# Patient Record
Sex: Male | Born: 2000 | Race: White | Hispanic: No | Marital: Single | Smoking: Current every day smoker
Health system: Southern US, Community
[De-identification: ages and names within clinical notes are randomized; demographics above are authoritative.]

## PROBLEM LIST (undated history)

## (undated) DIAGNOSIS — R569 Unspecified convulsions: Secondary | ICD-10-CM

## (undated) DIAGNOSIS — F302 Manic episode, severe with psychotic symptoms: Secondary | ICD-10-CM

## (undated) DIAGNOSIS — F840 Autistic disorder: Secondary | ICD-10-CM

## (undated) DIAGNOSIS — J45909 Unspecified asthma, uncomplicated: Secondary | ICD-10-CM

---

## 2019-07-22 ENCOUNTER — Emergency Department (HOSPITAL_COMMUNITY)
Admission: EM | Admit: 2019-07-22 | Discharge: 2019-07-23 | Disposition: A | Discharge status: Home / Self Care | Payer: Medicaid - Out of State | Attending: Emergency Medicine | Admitting: Emergency Medicine

## 2019-07-22 ENCOUNTER — Other Ambulatory Visit: Payer: Self-pay

## 2019-07-22 ENCOUNTER — Encounter (HOSPITAL_COMMUNITY): Payer: Self-pay

## 2019-07-22 DIAGNOSIS — J45909 Unspecified asthma, uncomplicated: Secondary | ICD-10-CM | POA: Insufficient documentation

## 2019-07-22 DIAGNOSIS — G40909 Epilepsy, unspecified, not intractable, without status epilepticus: Secondary | ICD-10-CM | POA: Diagnosis not present

## 2019-07-22 DIAGNOSIS — F840 Autistic disorder: Secondary | ICD-10-CM | POA: Insufficient documentation

## 2019-07-22 DIAGNOSIS — F17210 Nicotine dependence, cigarettes, uncomplicated: Secondary | ICD-10-CM | POA: Insufficient documentation

## 2019-07-22 DIAGNOSIS — F1210 Cannabis abuse, uncomplicated: Secondary | ICD-10-CM | POA: Insufficient documentation

## 2019-07-22 DIAGNOSIS — R569 Unspecified convulsions: Secondary | ICD-10-CM | POA: Diagnosis present

## 2019-07-22 HISTORY — DX: Unspecified asthma, uncomplicated: J45.909

## 2019-07-22 HISTORY — DX: Unspecified convulsions: R56.9

## 2019-07-22 HISTORY — DX: Manic episode, severe with psychotic symptoms: F30.2

## 2019-07-22 HISTORY — DX: Autistic disorder: F84.0

## 2019-07-22 LAB — CBC WITH DIFFERENTIAL/PLATELET
Abs Immature Granulocytes: 0.01 10*3/uL (ref 0.00–0.07)
Basophils Absolute: 0 10*3/uL (ref 0.0–0.1)
Basophils Relative: 1 %
Eosinophils Absolute: 0.2 10*3/uL (ref 0.0–0.5)
Eosinophils Relative: 4 %
HCT: 43.3 % (ref 39.0–52.0)
Hemoglobin: 14 g/dL (ref 13.0–17.0)
Immature Granulocytes: 0 %
Lymphocytes Relative: 35 %
Lymphs Abs: 2.3 10*3/uL (ref 0.7–4.0)
MCH: 28.5 pg (ref 26.0–34.0)
MCHC: 32.3 g/dL (ref 30.0–36.0)
MCV: 88 fL (ref 80.0–100.0)
Monocytes Absolute: 0.5 10*3/uL (ref 0.1–1.0)
Monocytes Relative: 7 %
Neutro Abs: 3.6 10*3/uL (ref 1.7–7.7)
Neutrophils Relative %: 53 %
Platelets: 206 10*3/uL (ref 150–400)
RBC: 4.92 MIL/uL (ref 4.22–5.81)
RDW: 13.7 % (ref 11.5–15.5)
WBC: 6.7 10*3/uL (ref 4.0–10.5)
nRBC: 0 % (ref 0.0–0.2)

## 2019-07-22 LAB — I-STAT CHEM 8, ED
BUN: 14 mg/dL (ref 6–20)
Calcium, Ion: 1.25 mmol/L (ref 1.15–1.40)
Chloride: 105 mmol/L (ref 98–111)
Creatinine, Ser: 0.9 mg/dL (ref 0.61–1.24)
Glucose, Bld: 93 mg/dL (ref 70–99)
HCT: 43 % (ref 39.0–52.0)
Hemoglobin: 14.6 g/dL (ref 13.0–17.0)
Potassium: 3.5 mmol/L (ref 3.5–5.1)
Sodium: 141 mmol/L (ref 135–145)
TCO2: 23 mmol/L (ref 22–32)

## 2019-07-22 MED ORDER — LEVETIRACETAM IN NACL 1000 MG/100ML IV SOLN
1000.0000 mg | Freq: Once | INTRAVENOUS | Status: AC
  Administered 2019-07-22: 1000 mg via INTRAVENOUS
  Filled 2019-07-22: qty 100

## 2019-07-22 NOTE — ED Notes (Signed)
Patient made aware that urine sample is needed. Urinal at bedside.  

## 2019-07-22 NOTE — ED Triage Notes (Signed)
Male friend called EMS d/t pt having seizure like activity last approx. 5 min. Pt has Hx seizure and take dilantin but has not been taking it as ordered.

## 2019-07-23 ENCOUNTER — Encounter (HOSPITAL_COMMUNITY): Payer: Self-pay | Admitting: Emergency Medicine

## 2019-07-23 ENCOUNTER — Emergency Department (HOSPITAL_COMMUNITY): Payer: Medicaid - Out of State

## 2019-07-23 LAB — RAPID URINE DRUG SCREEN, HOSP PERFORMED
Amphetamines: NOT DETECTED
Barbiturates: NOT DETECTED
Benzodiazepines: POSITIVE — AB
Cocaine: NOT DETECTED
Opiates: NOT DETECTED
Tetrahydrocannabinol: POSITIVE — AB

## 2019-07-23 LAB — PHENYTOIN LEVEL, TOTAL: Phenytoin Lvl: <2.5 ug/mL — ABNORMAL LOW (ref 10.0–20.0)

## 2019-07-23 MED ORDER — PHENYTOIN 50 MG PO CHEW
400.0000 mg | CHEWABLE_TABLET | ORAL | Status: AC
  Administered 2019-07-23: 400 mg via ORAL
  Filled 2019-07-23: qty 8

## 2019-07-23 NOTE — ED Notes (Signed)
Patient transported to CT 

## 2019-07-23 NOTE — ED Provider Notes (Signed)
[facility] DEPT Provider Note   CSN: [id number] Arrival date & time: 07/22/19  2257     History   Chief Complaint Chief Complaint  Patient presents with  . Seizures    HPI Alexander Jackson is a 18 y.o. male.     The history is provided by the patient.  Seizures Seizure activity on arrival: no   Seizure type:  Grand mal Preceding symptoms: no sensation of an aura present   Initial focality:  None Episode characteristics: generalized shaking   Episode characteristics: no tongue biting   Postictal symptoms: somnolence   Postictal symptoms: no confusion   Return to baseline: yes   Severity:  Moderate Timing:  Once Progression:  Resolved Context: medical non-compliance   Context: not alcohol withdrawal   Recent head injury:  No recent head injuries PTA treatment:  Midazolam History of seizures: yes   Severity:  Moderate Current therapy:  Phenytoin Compliance with current therapy:  Poor States he rations his medication as he can't afford it. Is feeling improved at this time.    Past Medical History:  Diagnosis Date  . Asthma   . Autism   . Bipolar I disorder, single manic episode, severe, with psychosis ([place])   . Seizures ([place])     There are no active problems to display for this patient.   History reviewed. No pertinent surgical history.      Home Medications    Prior to Admission medications   Not on File    Family History History reviewed. No pertinent family history.  Social History Social History   Tobacco Use  . Smoking status: Current Every Day Smoker    Packs/day: 1.00    Years: 10.00    Pack years: 10.00    Types: Cigarettes  . Smokeless tobacco: Never Used  Substance Use Topics  . Alcohol use: Yes    Comment: occasionally   . Drug use: Yes    Types: Marijuana     Allergies   Patient has no known allergies.   Review of Systems Review of Systems  Constitutional: Negative for fever.  HENT:  Negative for congestion.   Eyes: Negative for visual disturbance.  Respiratory: Negative for shortness of breath.   Cardiovascular: Negative for chest pain.  Gastrointestinal: Negative for abdominal distention.  Genitourinary: Negative for difficulty urinating.  Musculoskeletal: Positive for arthralgias.  Neurological: Positive for seizures.  Psychiatric/Behavioral: Negative for agitation.  All other systems reviewed and are negative.    Physical Exam Updated Vital Signs BP (!) 116/58 (BP Location: Right Arm)   Pulse (!) 57   Temp 98.8 F (37.1 C) (Oral)   Resp 20   Ht 6' (1.829 m)   Wt 90.7 kg   SpO2 96%   BMI 27.12 kg/m   Physical Exam Vitals signs and nursing note reviewed.  Constitutional:      General: He is not in acute distress.    Appearance: He is normal weight.  HENT:     Head: Normocephalic and atraumatic.     Nose: Nose normal.  Eyes:     Conjunctiva/sclera: Conjunctivae normal.     Pupils: Pupils are equal, round, and reactive to light.  Neck:     Musculoskeletal: Normal range of motion and neck supple.  Cardiovascular:     Rate and Rhythm: Normal rate and regular rhythm.     Pulses: Normal pulses.     Heart sounds: Normal heart sounds.  Pulmonary:     Effort: Pulmonary  effort is normal.     Breath sounds: Normal breath sounds.  Abdominal:     General: Abdomen is flat. Bowel sounds are normal.     Tenderness: There is no abdominal tenderness. There is no rebound.  Musculoskeletal: Normal range of motion.  Skin:    General: Skin is warm and dry.     Capillary Refill: Capillary refill takes less than 2 seconds.  Neurological:     General: No focal deficit present.     Mental Status: He is alert and oriented to person, place, and time.  Psychiatric:        Mood and Affect: Mood normal.        Behavior: Behavior normal.      ED Treatments / Results  Labs (all labs ordered are listed, but only abnormal results are displayed) Results for orders  placed or performed during the hospital encounter of 07/22/19  CBC with Differential/Platelet  Result Value Ref Range   WBC 6.7 4.0 - 10.5 K/uL   RBC 4.92 4.22 - 5.81 MIL/uL   Hemoglobin 14.0 13.0 - 17.0 g/dL   HCT 36.1 44.3 - 15.4 %   MCV 88.0 80.0 - 100.0 fL   MCH 28.5 26.0 - 34.0 pg   MCHC 32.3 30.0 - 36.0 g/dL   RDW 00.8 67.6 - 19.5 %   Platelets 206 150 - 400 K/uL   nRBC 0.0 0.0 - 0.2 %   Neutrophils Relative % 53 %   Neutro Abs 3.6 1.7 - 7.7 K/uL   Lymphocytes Relative 35 %   Lymphs Abs 2.3 0.7 - 4.0 K/uL   Monocytes Relative 7 %   Monocytes Absolute 0.5 0.1 - 1.0 K/uL   Eosinophils Relative 4 %   Eosinophils Absolute 0.2 0.0 - 0.5 K/uL   Basophils Relative 1 %   Basophils Absolute 0.0 0.0 - 0.1 K/uL   Immature Granulocytes 0 %   Abs Immature Granulocytes 0.01 0.00 - 0.07 K/uL  Phenytoin level, total  Result Value Ref Range   Phenytoin Lvl <2.5 (L) 10.0 - 20.0 ug/mL  I-stat chem 8, ED (not at [facility] or [facility])  Result Value Ref Range   Sodium 141 135 - 145 mmol/L   Potassium 3.5 3.5 - 5.1 mmol/L   Chloride 105 98 - 111 mmol/L   BUN 14 6 - 20 mg/dL   Creatinine, Ser 0.93 0.61 - 1.24 mg/dL   Glucose, Bld 93 70 - 99 mg/dL   Calcium, Ion 2.67 1.24 - 1.40 mmol/L   TCO2 23 22 - 32 mmol/L   Hemoglobin 14.6 13.0 - 17.0 g/dL   HCT 58.0 99.8 - 33.8 %   No results found.  EKG None  Radiology No results found.  Procedures Procedures (including critical care time)  Medications Ordered in ED Medications  levETIRAcetam (KEPPRA) IVPB 1000 mg/100 mL premix (0 mg Intravenous Stopped 07/23/19 0011)  phenytoin (DILANTIN) chewable tablet 400 mg (400 mg Oral Given 07/23/19 0116)     PO challenged successfully.  Returned to baseline.  Will refer to outpatient neurology.  Informed he cannot drive a car or operate machinery until cleared by neurology or seizure free for more than 6 months.  Patient verbalizes understanding and agrees to follow up.    Alexander Jackson was evaluated in  Emergency Department on 07/23/2019 for the symptoms described in the history of present illness. He was evaluated in the context of the global COVID-19 pandemic, which necessitated consideration that the patient might be at risk for  infection with the SARS-CoV-2 virus that causes COVID-19. Institutional protocols and algorithms that pertain to the evaluation of patients at risk for COVID-19 are in a state of rapid change based on information released by regulatory bodies including the CDC and federal and state organizations. These policies and algorithms were followed during the patient's care in the ED.   Final Clinical Impressions(s) / ED Diagnoses   Final diagnoses:  Seizure disorder (HCC)     Return for intractable cough, coughing up blood,fevers >100.4 unrelieved by medication, shortness of breath, intractable vomiting, chest pain, shortness of breath, weakness,numbness, changes in speech, facial asymmetry,abdominal pain, passing out,Inability to tolerate liquids or food, cough, altered mental status or any concerns. No signs of systemic illness or infection. The patient is nontoxic-appearing on exam and vital signs are within normal limits.   I have reviewed the triage vital signs and the nursing notes. Pertinent labs &imaging results that were available during my care of the patient were reviewed by me and considered in my medical decision making (see chart for details).After history, exam, and medical workup I feel the patient has beenappropriately medically screened and is safe for discharge home. Pertinent diagnoses were discussed with the patient. Patient was given return precautions.   Alethia Melendrez, MD 07/23/19 [id number]

## 2019-07-23 NOTE — ED Notes (Signed)
Patient able to tolerate oral fluids at this time. Will continue to monitor.

## 2019-08-22 ENCOUNTER — Emergency Department (HOSPITAL_COMMUNITY)
Admission: EM | Admit: 2019-08-22 | Discharge: 2019-08-22 | Disposition: A | Discharge status: Home / Self Care | Payer: Medicaid - Out of State | Attending: Emergency Medicine | Admitting: Emergency Medicine

## 2019-08-22 ENCOUNTER — Emergency Department (HOSPITAL_COMMUNITY): Payer: Medicaid - Out of State

## 2019-08-22 ENCOUNTER — Other Ambulatory Visit: Payer: Self-pay

## 2019-08-22 ENCOUNTER — Encounter (HOSPITAL_COMMUNITY): Payer: Self-pay

## 2019-08-22 DIAGNOSIS — F840 Autistic disorder: Secondary | ICD-10-CM | POA: Insufficient documentation

## 2019-08-22 DIAGNOSIS — Z888 Allergy status to other drugs, medicaments and biological substances status: Secondary | ICD-10-CM | POA: Insufficient documentation

## 2019-08-22 DIAGNOSIS — R569 Unspecified convulsions: Secondary | ICD-10-CM | POA: Diagnosis present

## 2019-08-22 DIAGNOSIS — F17210 Nicotine dependence, cigarettes, uncomplicated: Secondary | ICD-10-CM | POA: Insufficient documentation

## 2019-08-22 DIAGNOSIS — J45909 Unspecified asthma, uncomplicated: Secondary | ICD-10-CM | POA: Insufficient documentation

## 2019-08-22 DIAGNOSIS — Z79899 Other long term (current) drug therapy: Secondary | ICD-10-CM | POA: Diagnosis not present

## 2019-08-22 DIAGNOSIS — G40909 Epilepsy, unspecified, not intractable, without status epilepticus: Secondary | ICD-10-CM | POA: Insufficient documentation

## 2019-08-22 LAB — I-STAT CHEM 8, ED
BUN: 11 mg/dL (ref 6–20)
Calcium, Ion: 1.22 mmol/L (ref 1.15–1.40)
Chloride: 103 mmol/L (ref 98–111)
Creatinine, Ser: 1 mg/dL (ref 0.61–1.24)
Glucose, Bld: 101 mg/dL — ABNORMAL HIGH (ref 70–99)
HCT: 43 % (ref 39.0–52.0)
Hemoglobin: 14.6 g/dL (ref 13.0–17.0)
Potassium: 3.6 mmol/L (ref 3.5–5.1)
Sodium: 142 mmol/L (ref 135–145)
TCO2: 25 mmol/L (ref 22–32)

## 2019-08-22 MED ORDER — SODIUM CHLORIDE 0.9 % IV SOLN
1000.0000 mg | Freq: Once | INTRAVENOUS | Status: AC
  Administered 2019-08-22: 1000 mg via INTRAVENOUS
  Filled 2019-08-22: qty 20
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
## 2019-08-22 NOTE — ED Notes (Signed)
Patient transported to CT 
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
## 2020-02-09 ENCOUNTER — Emergency Department (HOSPITAL_COMMUNITY): Payer: Medicaid - Out of State

## 2020-02-09 ENCOUNTER — Emergency Department (HOSPITAL_COMMUNITY)
Admission: EM | Admit: 2020-02-09 | Discharge: 2020-02-09 | Disposition: A | Discharge status: Home / Self Care | Payer: Medicaid - Out of State | Attending: Emergency Medicine | Admitting: Emergency Medicine

## 2020-02-09 ENCOUNTER — Other Ambulatory Visit: Payer: Self-pay

## 2020-02-09 ENCOUNTER — Encounter (HOSPITAL_COMMUNITY): Payer: Self-pay | Admitting: Emergency Medicine

## 2020-02-09 DIAGNOSIS — R569 Unspecified convulsions: Secondary | ICD-10-CM | POA: Diagnosis present

## 2020-02-09 DIAGNOSIS — Z9114 Patient's other noncompliance with medication regimen: Secondary | ICD-10-CM | POA: Insufficient documentation

## 2020-02-09 DIAGNOSIS — G40919 Epilepsy, unspecified, intractable, without status epilepticus: Secondary | ICD-10-CM

## 2020-02-09 DIAGNOSIS — F17210 Nicotine dependence, cigarettes, uncomplicated: Secondary | ICD-10-CM | POA: Diagnosis not present

## 2020-02-09 LAB — CBC WITH DIFFERENTIAL/PLATELET
Abs Immature Granulocytes: 0.01 10*3/uL (ref 0.00–0.07)
Basophils Absolute: 0.1 10*3/uL (ref 0.0–0.1)
Basophils Relative: 1 %
Eosinophils Absolute: 0.3 10*3/uL (ref 0.0–0.5)
Eosinophils Relative: 4 %
HCT: 45 % (ref 39.0–52.0)
Hemoglobin: 14.4 g/dL (ref 13.0–17.0)
Immature Granulocytes: 0 %
Lymphocytes Relative: 25 %
Lymphs Abs: 1.9 10*3/uL (ref 0.7–4.0)
MCH: 28.4 pg (ref 26.0–34.0)
MCHC: 32 g/dL (ref 30.0–36.0)
MCV: 88.8 fL (ref 80.0–100.0)
Monocytes Absolute: 0.6 10*3/uL (ref 0.1–1.0)
Monocytes Relative: 8 %
Neutro Abs: 4.8 10*3/uL (ref 1.7–7.7)
Neutrophils Relative %: 62 %
Platelets: 243 10*3/uL (ref 150–400)
RBC: 5.07 MIL/uL (ref 4.22–5.81)
RDW: 13.3 % (ref 11.5–15.5)
WBC: 7.7 10*3/uL (ref 4.0–10.5)
nRBC: 0 % (ref 0.0–0.2)

## 2020-02-09 LAB — BASIC METABOLIC PANEL
Anion gap: 8 (ref 5–15)
BUN: 14 mg/dL (ref 6–20)
CO2: 24 mmol/L (ref 22–32)
Calcium: 9.1 mg/dL (ref 8.9–10.3)
Chloride: 108 mmol/L (ref 98–111)
Creatinine, Ser: 0.79 mg/dL (ref 0.61–1.24)
GFR calc Af Amer: >60 mL/min (ref >60)
GFR calc non Af Amer: >60 mL/min (ref >60)
Glucose, Bld: 97 mg/dL (ref 70–99)
Potassium: 3.8 mmol/L (ref 3.5–5.1)
Sodium: 140 mmol/L (ref 135–145)

## 2020-02-09 LAB — CBG MONITORING, ED: Glucose-Capillary: 129 mg/dL — ABNORMAL HIGH (ref 70–99)

## 2020-02-09 LAB — ETHANOL: Alcohol, Ethyl (B): <10 mg/dL (ref <10)

## 2020-02-09 MED ORDER — LEVETIRACETAM IN NACL 1000 MG/100ML IV SOLN
1000.0000 mg | Freq: Once | INTRAVENOUS | Status: AC
  Administered 2020-02-09: 11:00:00 1000 mg via INTRAVENOUS
  Filled 2020-02-09: qty 100

## 2020-02-09 MED ORDER — LEVETIRACETAM 500 MG PO TABS: 500.0000 mg | ORAL_TABLET | Freq: Two times a day (BID) | ORAL | 0 refills | Status: DC

## 2020-02-09 NOTE — ED Provider Notes (Signed)
[name]   CSN: [id number] Arrival date & time: 02/09/20  1032     History Chief Complaint  Patient presents with  . Seizures    Alexander Jackson is a 19 y.o. male with a past medical history of autism, bipolar disorder, seizure disorder noncompliant with Keppra presenting to the ED with a chief complaint of seizure.  States that he was skateboarding just prior to arrival when he felt like he had a seizure.  States that he has been having pain to the right side of his head since then.  Also states that "my legs feel like Jell-O" which is typical after he has a seizure.  He admits that he has not been taking his Keppra but unwilling to tell me how long he has been out of it.  Denies any tongue or lip trauma, loss of bowels or bladder, vision changes, numbness in arms or legs, vomiting, fever.  HPI     Past Medical History:  Diagnosis Date  . Asthma   . Autism   . Bipolar I disorder, single manic episode, severe, with psychosis ([place])   . Seizures ([place])     There are no problems to display for this patient.   History reviewed. No pertinent surgical history.     History reviewed. No pertinent family history.  Social History   Tobacco Use  . Smoking status: Current Every Day Smoker    Packs/day: 1.00    Years: 10.00    Pack years: 10.00    Types: Cigarettes  . Smokeless tobacco: Never Used  Substance Use Topics  . Alcohol use: Yes    Comment: occasionally   . Drug use: Yes    Types: Marijuana    Home Medications Prior to Admission medications   Medication Sig Start Date End Date Taking? Authorizing Provider  levETIRAcetam (KEPPRA) 500 MG tablet Take 1 tablet (500 mg total) by mouth in the morning and at bedtime. 02/09/20   Rhenda Oregon, PA-C    Allergies    Geodon [ziprasidone hcl]  Review of Systems   Review of Systems  Constitutional: Negative for appetite change, chills and fever.  HENT: Negative for ear  pain, rhinorrhea, sneezing and sore throat.   Eyes: Negative for photophobia and visual disturbance.  Respiratory: Negative for cough, chest tightness, shortness of breath and wheezing.   Cardiovascular: Negative for chest pain and palpitations.  Gastrointestinal: Negative for abdominal pain, blood in stool, constipation, diarrhea, nausea and vomiting.  Genitourinary: Negative for dysuria, hematuria and urgency.  Musculoskeletal: Negative for myalgias.  Skin: Negative for rash.  Neurological: Positive for seizures and headaches. Negative for dizziness, weakness and light-headedness.    Physical Exam Updated Vital Signs BP 115/66   Pulse 66   Temp 98 F (36.7 C) (Oral)   Resp 20   Ht 5\' 11"  (1.803 m)   Wt 90.7 kg   SpO2 92%   BMI 27.89 kg/m   Physical Exam Vitals and nursing note reviewed.  Constitutional:      General: He is not in acute distress.    Appearance: He is well-developed.  HENT:     Head: Normocephalic and atraumatic.     Nose: Nose normal.     Mouth/Throat:     Comments: No signs of lip or tongue trauma. Eyes:     General: No scleral icterus.       Right eye: No discharge.        Left eye: No discharge.  Conjunctiva/sclera: Conjunctivae normal.     Pupils: Pupils are equal, round, and reactive to light.  Cardiovascular:     Rate and Rhythm: Normal rate and regular rhythm.     Heart sounds: Normal heart sounds. No murmur. No friction rub. No gallop.   Pulmonary:     Effort: Pulmonary effort is normal. No respiratory distress.     Breath sounds: Normal breath sounds.  Abdominal:     General: Bowel sounds are normal. There is no distension.     Palpations: Abdomen is soft.     Tenderness: There is no abdominal tenderness. There is no guarding.  Musculoskeletal:        General: Normal range of motion.     Cervical back: Normal range of motion and neck supple.     Comments: No midline spinal tenderness present in lumbar, thoracic or cervical spine. No  step-off palpated. No visible bruising, edema or temperature change noted. No objective signs of numbness present. No saddle anesthesia. 2+ DP pulses bilaterally. Sensation intact to light touch. Strength 5/5 in bilateral lower extremities.  Skin:    General: Skin is warm and dry.     Findings: No rash.  Neurological:     General: No focal deficit present.     Mental Status: He is alert and oriented to person, place, and time.     Cranial Nerves: No cranial nerve deficit.     Sensory: No sensory deficit.     Motor: No weakness or abnormal muscle tone.     Coordination: Coordination normal.     ED Results / Procedures / Treatments   Labs (all labs ordered are listed, but only abnormal results are displayed) Labs Reviewed  CBG MONITORING, ED - Abnormal; Notable for the following components:      Result Value   Glucose-Capillary 129 (*)    All other components within normal limits  BASIC METABOLIC PANEL  CBC WITH DIFFERENTIAL/PLATELET  ETHANOL    EKG None  Radiology CT HEAD WO CONTRAST  Result Date: 02/09/2020 CLINICAL DATA:  Seizure with apparent fall.  Headache. EXAM: CT HEAD WITHOUT CONTRAST TECHNIQUE: Contiguous axial images were obtained from the base of the skull through the vertex without intravenous contrast. COMPARISON:  August 22, 2019. FINDINGS: Brain: Ventricles and sulci are normal in size and configuration. There is no intracranial mass, hemorrhage, extra-axial fluid collection, or midline shift. The brain parenchyma appears unremarkable. No demonstrable acute infarct. Vascular: No hyperdense vessel. No vascular calcifications are evident. Skull: The bony calvarium appears intact. Sinuses/Orbits: Visualized paranasal sinuses are clear. Visualized orbits appear symmetric bilaterally. Other: Mastoid air cells are clear. IMPRESSION: Study within normal limits. Electronically Signed   By: Bretta Bang III M.D.   On: 02/09/2020 12:01    Procedures Procedures  (including critical care time)  Medications Ordered in ED Medications  levETIRAcetam (KEPPRA) IVPB 1000 mg/100 mL premix (0 mg Intravenous Stopped 02/09/20 1123)    ED Course  I have reviewed the triage vital signs and the nursing notes.  Pertinent labs & imaging results that were available during my care of the patient were reviewed by me and considered in my medical decision making (see chart for details).    MDM Rules/Calculators/A&P                      19 year old male with past medical history of seizure disorder noncompliant with Keppra presenting to the ED for seizure that occurred prior to arrival.  Patient was  skateboarding outside by himself when he believes he had a seizure.  States that he usually has generalized weakness of bilateral lower extremities after his seizure.  Denies any tongue or lip trauma, loss of bowel or bladder function.  He reports pain to the right side of his head and is concerned that he may have hit his head during seizure.  Patient admits that he is not taking his Keppra but unwilling to tell me how long he has been out.  Chart review shows that patient presented to [facility] ED in the beginning of March for breakthrough seizure.  He was given 30-day supply of Keppra I believe he may have run out about 2 weeks ago.  No neurological deficits or trauma noted on exam.  Will obtain lab work and imaging, give loading dose of Keppra and reassess.  Lab work here is unremarkable including electrolytes, blood count and CBG. Normal ethanol level. CT head without any concerning acute findings. Patient given IV dose of Keppra here. Observed for 3.5 hours without any recurrence of seizures.  Stressed importance of taking his Keppra to prevent breakthrough seizures. Told him he will need to follow up with neurology.  He voices understanding.  We will provide him with follow-up and refill.  All imaging, if done today, including plain films, CT scans, and ultrasounds,  independently reviewed by me, and interpretations confirmed via formal radiology reads.  Patient is hemodynamically stable, in NAD, and able to ambulate in the ED. Evaluation does not show pathology that would require ongoing emergent intervention or inpatient treatment. I explained the diagnosis to the patient. Pain has been managed and has no complaints prior to discharge. Patient is comfortable with above plan and is stable for discharge at this time. All questions were answered prior to disposition. Strict return precautions for returning to the ED were discussed. Encouraged follow up with PCP.   An After Visit Summary was printed and given to the patient.   Portions of this note were generated with Scientist, clinical (histocompatibility and immunogenetics). Dictation errors may occur despite best attempts at proofreading.  Final Clinical Impression(s) / ED Diagnoses Final diagnoses:  Breakthrough seizure [facility])    Rx / DC Orders ED Discharge Orders         Ordered    levETIRAcetam (KEPPRA) 500 MG tablet  2 times daily     02/09/20 1345           Dietrich Pates, PA-C 02/09/20 1346    Long, Arlyss Repress, MD 02/10/20 [phone]

## 2020-02-09 NOTE — ED Triage Notes (Signed)
Patient arrived by EMS from somewhere around town in [place]. Patient was skateboarding when patient had an unwitnessed seizure.   Patient c/o RT sided head pain.   Patient has history of Epilepsy, Bipolar, and Autism.

## 2020-02-09 NOTE — Discharge Instructions (Signed)
It is important for you to take your Keppra as prescribed. If you run out of this medication you will need to ask for a refill as soon as possible prevent breakthrough seizures. Return to the ER if you have another seizure, worsening headache, blurry vision, numbness in arms or legs.

## 2020-02-25 ENCOUNTER — Encounter (HOSPITAL_COMMUNITY): Payer: Self-pay | Admitting: Emergency Medicine

## 2020-02-25 ENCOUNTER — Emergency Department (HOSPITAL_COMMUNITY)
Admission: EM | Admit: 2020-02-25 | Discharge: 2020-02-25 | Disposition: A | Discharge status: Home / Self Care | Payer: Medicaid - Out of State | Attending: Emergency Medicine | Admitting: Emergency Medicine

## 2020-02-25 ENCOUNTER — Other Ambulatory Visit: Payer: Self-pay

## 2020-02-25 DIAGNOSIS — R569 Unspecified convulsions: Secondary | ICD-10-CM | POA: Diagnosis present

## 2020-02-25 DIAGNOSIS — F17210 Nicotine dependence, cigarettes, uncomplicated: Secondary | ICD-10-CM | POA: Insufficient documentation

## 2020-02-25 DIAGNOSIS — J45909 Unspecified asthma, uncomplicated: Secondary | ICD-10-CM | POA: Insufficient documentation

## 2020-02-25 DIAGNOSIS — F840 Autistic disorder: Secondary | ICD-10-CM | POA: Diagnosis not present

## 2020-02-25 LAB — CBG MONITORING, ED: Glucose-Capillary: 80 mg/dL (ref 70–99)

## 2020-02-25 MED ORDER — LEVETIRACETAM 500 MG PO TABS
500.0000 mg | ORAL_TABLET | Freq: Once | ORAL | Status: AC
  Administered 2020-02-25: 500 mg via ORAL
  Filled 2020-02-25: qty 1

## 2020-02-25 MED ORDER — LEVETIRACETAM 500 MG PO TABS: 500.0000 mg | ORAL_TABLET | Freq: Two times a day (BID) | ORAL | 0 refills | Status: AC

## 2020-02-25 NOTE — ED Notes (Signed)
Patient verbalizes understanding of discharge instructions. Opportunity for questioning and answers were provided. Armband removed by staff, pt discharged from ED. Pt is homeless, states he plans to walk to next destination "like I always do"
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
## 2020-02-25 NOTE — ED Provider Notes (Signed)
[facility] EMERGENCY DEPARTMENT Provider Note   CSN: [id number] Arrival date & time: 02/25/20  2104     History Chief Complaint  Patient presents with  . Seizures    Alexander Jackson is a 19 y.o. male.  The history is provided by the patient.  Seizures Seizure activity on arrival: no   Seizure type:  Unable to specify Preceding symptoms: no sensation of an aura present, no dizziness, no euphoria, no headache, no hyperventilation, no nausea, no numbness, no panic and no vision change   Episode characteristics: no abnormal movements, no apnea, no combativeness, no confusion, no disorientation, no eye deviation, no focal shaking, no generalized shaking, no incontinence, no limpness, fully responsive, no stiffening, no tongue biting and responsive   Postictal symptoms: no confusion, no memory loss and no somnolence   Return to baseline: yes   Severity:  Mild Timing:  Once Progression:  Resolved Context: medical non-compliance   PTA treatment:  None History of seizures: yes        Past Medical History:  Diagnosis Date  . Asthma   . Autism   . Bipolar I disorder, single manic episode, severe, with psychosis (HCC)   . Seizures (HCC)     There are no problems to display for this patient.   History reviewed. No pertinent surgical history.     History reviewed. No pertinent family history.  Social History   Tobacco Use  . Smoking status: Current Every Day Smoker    Packs/day: 1.00    Years: 10.00    Pack years: 10.00    Types: Cigarettes  . Smokeless tobacco: Never Used  Substance Use Topics  . Alcohol use: Yes    Comment: occasionally   . Drug use: Yes    Types: Marijuana    Home Medications Prior to Admission medications   Medication Sig Start Date End Date Taking? Authorizing Provider  levETIRAcetam (KEPPRA) 500 MG tablet Take 1 tablet (500 mg total) by mouth 2 (two) times daily. 02/25/20 03/26/20  Virgina Norfolk, DO    Allergies    Geodon  [ziprasidone hcl]  Review of Systems   Review of Systems  Constitutional: Negative for chills and fever.  HENT: Negative for ear pain and sore throat.   Eyes: Negative for pain and visual disturbance.  Respiratory: Negative for cough and shortness of breath.   Cardiovascular: Negative for chest pain and palpitations.  Gastrointestinal: Negative for abdominal pain and vomiting.  Genitourinary: Negative for dysuria and hematuria.  Musculoskeletal: Negative for arthralgias and back pain.  Skin: Negative for color change and rash.  Neurological: Positive for seizures. Negative for dizziness, tremors, syncope, facial asymmetry, speech difficulty, weakness, light-headedness, numbness and headaches.  All other systems reviewed and are negative.   Physical Exam Updated Vital Signs  ED Triage Vitals [02/25/20 2107]  Enc Vitals Group     BP 123/67     Pulse Rate 75     Resp 16     Temp 98 F (36.7 C)     Temp Source Oral     SpO2 96 %     Weight      Height      Head Circumference      Peak Flow      Pain Score 0     Pain Loc      Pain Edu?      Excl. in GC?     Physical Exam Vitals and nursing note reviewed.  Constitutional:  General: He is not in acute distress.    Appearance: He is well-developed. He is not ill-appearing.  HENT:     Head: Normocephalic and atraumatic.     Nose: Nose normal.     Mouth/Throat:     Mouth: Mucous membranes are moist.  Eyes:     Extraocular Movements: Extraocular movements intact.     Conjunctiva/sclera: Conjunctivae normal.     Pupils: Pupils are equal, round, and reactive to light.  Cardiovascular:     Rate and Rhythm: Normal rate and regular rhythm.     Pulses: Normal pulses.     Heart sounds: Normal heart sounds. No murmur.  Pulmonary:     Effort: Pulmonary effort is normal. No respiratory distress.     Breath sounds: Normal breath sounds.  Abdominal:     Palpations: Abdomen is soft.     Tenderness: There is no abdominal  tenderness.  Musculoskeletal:        General: Normal range of motion.     Cervical back: Normal range of motion and neck supple. No tenderness.  Skin:    General: Skin is warm and dry.  Neurological:     General: No focal deficit present.     Mental Status: He is alert and oriented to person, place, and time.     Cranial Nerves: No cranial nerve deficit.     Sensory: No sensory deficit.     Motor: No weakness.     Coordination: Coordination normal.     Gait: Gait normal.     Comments: 5+ out of 5 strength throughout, normal sensation, no drift, normal finger-nose-finger  Psychiatric:        Mood and Affect: Mood normal.     ED Results / Procedures / Treatments   Labs (all labs ordered are listed, but only abnormal results are displayed) Labs Reviewed  CBG MONITORING, ED    EKG None  Radiology No results found.  Procedures Procedures (including critical care time)  Medications Ordered in ED Medications  levETIRAcetam (KEPPRA) tablet 500 mg (500 mg Oral Given 02/25/20 2129)    ED Course  I have reviewed the triage vital signs and the nursing notes.  Pertinent labs & imaging results that were available during my care of the patient were reviewed by me and considered in my medical decision making (see chart for details).    MDM Rules/Calculators/A&P                      Alexander Jackson is a 19 year old male history of seizure, bipolar disorder noncompliant.  Possibly homeless.  Normal vitals.  No fever.  Had breakthrough seizure today.  Has been on Keppra in the past.  Blood sugar normal.  Given Keppra here.  At his baseline.  Neurologically intact.  No trauma.  Social work not currently here at this time.  However I have sent prescription for Keppra to the wellness center.  Patient states that if social work can call his emergency contact number he can talk with them.  Hopefully patient is able to get his medication for free.  Discharged in good condition.  This chart was  dictated using voice recognition software.  Despite best efforts to proofread,  errors can occur which can change the documentation meaning.    Final Clinical Impression(s) / ED Diagnoses Final diagnoses:  Seizure (HCC)    Rx / DC Orders ED Discharge Orders         Ordered  levETIRAcetam (KEPPRA) 500 MG tablet  2 times daily     02/25/20 2242           Lennice Sites, DO 02/25/20 2244

## 2020-02-25 NOTE — ED Notes (Signed)
Pt ambulated to br

## 2020-02-25 NOTE — ED Notes (Signed)
Pt ambulates independently in hallway, appears steady

## 2020-02-26 MED FILL — levETIRAcetam 500 MG TABS: 500 | 30 days supply | Qty: 60 | Fill #0

## 2020-03-21 ENCOUNTER — Emergency Department (HOSPITAL_COMMUNITY)
Admission: EM | Admit: 2020-03-21 | Discharge: 2020-03-21 | Disposition: A | Discharge status: Home / Self Care | Payer: Medicaid - Out of State | Attending: Emergency Medicine | Admitting: Emergency Medicine

## 2020-03-21 ENCOUNTER — Other Ambulatory Visit: Payer: Self-pay

## 2020-03-21 DIAGNOSIS — R569 Unspecified convulsions: Secondary | ICD-10-CM

## 2020-03-21 DIAGNOSIS — Z9114 Patient's other noncompliance with medication regimen: Secondary | ICD-10-CM | POA: Diagnosis not present

## 2020-03-21 DIAGNOSIS — F840 Autistic disorder: Secondary | ICD-10-CM | POA: Insufficient documentation

## 2020-03-21 DIAGNOSIS — J45909 Unspecified asthma, uncomplicated: Secondary | ICD-10-CM | POA: Diagnosis not present

## 2020-03-21 DIAGNOSIS — F17210 Nicotine dependence, cigarettes, uncomplicated: Secondary | ICD-10-CM | POA: Diagnosis not present

## 2020-03-21 LAB — CBC WITH DIFFERENTIAL/PLATELET
Abs Immature Granulocytes: 0.03 10*3/uL (ref 0.00–0.07)
Basophils Absolute: 0.1 10*3/uL (ref 0.0–0.1)
Basophils Relative: 1 %
Eosinophils Absolute: 0.5 10*3/uL (ref 0.0–0.5)
Eosinophils Relative: 5 %
HCT: 44.2 % (ref 39.0–52.0)
Hemoglobin: 14.1 g/dL (ref 13.0–17.0)
Immature Granulocytes: 0 %
Lymphocytes Relative: 30 %
Lymphs Abs: 2.7 10*3/uL (ref 0.7–4.0)
MCH: 28.8 pg (ref 26.0–34.0)
MCHC: 31.9 g/dL (ref 30.0–36.0)
MCV: 90.4 fL (ref 80.0–100.0)
Monocytes Absolute: 0.7 10*3/uL (ref 0.1–1.0)
Monocytes Relative: 8 %
Neutro Abs: 5.2 10*3/uL (ref 1.7–7.7)
Neutrophils Relative %: 56 %
Platelets: 234 10*3/uL (ref 150–400)
RBC: 4.89 MIL/uL (ref 4.22–5.81)
RDW: 13.2 % (ref 11.5–15.5)
WBC: 9.1 10*3/uL (ref 4.0–10.5)
nRBC: 0 % (ref 0.0–0.2)

## 2020-03-21 LAB — COMPREHENSIVE METABOLIC PANEL
ALT: 24 U/L (ref 0–44)
AST: 21 U/L (ref 15–41)
Albumin: 4.2 g/dL (ref 3.5–5.0)
Alkaline Phosphatase: 66 U/L (ref 38–126)
Anion gap: 6 (ref 5–15)
BUN: 16 mg/dL (ref 6–20)
CO2: 29 mmol/L (ref 22–32)
Calcium: 8.9 mg/dL (ref 8.9–10.3)
Chloride: 109 mmol/L (ref 98–111)
Creatinine, Ser: 0.98 mg/dL (ref 0.61–1.24)
GFR calc Af Amer: >60 mL/min (ref >60)
GFR calc non Af Amer: >60 mL/min (ref >60)
Glucose, Bld: 99 mg/dL (ref 70–99)
Potassium: 4.5 mmol/L (ref 3.5–5.1)
Sodium: 144 mmol/L (ref 135–145)
Total Bilirubin: 0.4 mg/dL (ref 0.3–1.2)
Total Protein: 6.6 g/dL (ref 6.5–8.1)

## 2020-03-21 MED ORDER — LEVETIRACETAM IN NACL 1000 MG/100ML IV SOLN
1000.0000 mg | Freq: Once | INTRAVENOUS | Status: AC
  Administered 2020-03-21: 1000 mg via INTRAVENOUS
  Filled 2020-03-21: qty 100

## 2020-03-21 NOTE — ED Triage Notes (Signed)
PER EMS: Pt was found on the side of the road with a c/o seizures. A bystander saw the patient having a seizure. Pt was confused at first but is now A&Ox4. Pt has no trauma or incontinence. Endorses a headache and is sensitive to lights. Hx seizures. Noncompliant with medications.  EMS VITALS: BP 130/74 HR 96 RR 16 SPO2 94% RA CBG 108 TEMP 98.1

## 2020-03-21 NOTE — ED Provider Notes (Signed)
[facility] DEPT Provider Note   CSN: [id number] Arrival date & time: 03/21/20  2046     History Chief Complaint  Patient presents with  . Seizures    Alexander Jackson is a 19 y.o. male.  HPI      19yo male with history of bipolar disorder, seizures, asthma, homelessness, presents with concern for seizure.  He has been unable to afford his seizure medications and is hoping in 2 weeks he will be able to. Is working at [employer]. Reports he began to feel like he was going to have a seizure with the typical symptoms of headache, light sensitivity and began to walk to the hospital but the next thing he knew he woke up in the grass surrounded by people. Bystander saw him have a seizure.  He has not been taking his medications. It appears that SW/CM was consulted to assist with medications last visit and script was sent but he did not pick it up.    Denies incontinence, tongue biting. Having typical post-seizure headache and light sensitivity. Denies other concerns.   Past Medical History:  Diagnosis Date  . Asthma   . Autism   . Bipolar I disorder, single manic episode, severe, with psychosis ([place])   . Seizures ([place])     There are no problems to display for this patient.   No past surgical history on file.     No family history on file.  Social History   Tobacco Use  . Smoking status: Current Every Day Smoker    Packs/day: 1.00    Years: 10.00    Pack years: 10.00    Types: Cigarettes  . Smokeless tobacco: Never Used  Substance Use Topics  . Alcohol use: Yes    Comment: occasionally   . Drug use: Yes    Types: Marijuana    Home Medications Prior to Admission medications   Medication Sig Start Date End Date Taking? Authorizing Provider  levETIRAcetam (KEPPRA) 500 MG tablet Take 1 tablet (500 mg total) by mouth 2 (two) times daily. Patient not taking: Reported on 03/21/2020 02/25/20 03/26/20  Lennice Sites, DO    Allergies    Geodon  [ziprasidone hcl]  Review of Systems   Review of Systems  Constitutional: Negative for fever.  HENT: Negative for sore throat.   Eyes: Negative for visual disturbance.  Respiratory: Negative for shortness of breath.   Cardiovascular: Negative for chest pain.  Gastrointestinal: Negative for abdominal pain, nausea and vomiting.  Genitourinary: Negative for difficulty urinating.  Musculoskeletal: Negative for back pain and neck stiffness.  Skin: Negative for rash.  Neurological: Positive for seizures and headaches. Negative for dizziness, syncope, facial asymmetry, weakness and numbness.    Physical Exam Updated Vital Signs BP 115/65   Pulse (!) 51   Temp 97.7 F (36.5 C) (Oral)   Resp 12   Ht 5\' 11"  (1.803 m)   Wt 72.6 kg   SpO2 100%   BMI 22.32 kg/m   Physical Exam Vitals and nursing note reviewed.  Constitutional:      General: He is not in acute distress.    Appearance: Normal appearance. He is well-developed. He is not ill-appearing or diaphoretic.  HENT:     Head: Normocephalic and atraumatic.  Eyes:     General: No visual field deficit.    Extraocular Movements: Extraocular movements intact.     Conjunctiva/sclera: Conjunctivae normal.     Pupils: Pupils are equal, round, and reactive to light.  Cardiovascular:  Rate and Rhythm: Normal rate and regular rhythm.     Pulses: Normal pulses.     Heart sounds: Normal heart sounds. No murmur. No friction rub. No gallop.   Pulmonary:     Effort: Pulmonary effort is normal. No respiratory distress.     Breath sounds: Normal breath sounds. No wheezing or rales.  Abdominal:     General: There is no distension.     Palpations: Abdomen is soft.     Tenderness: There is no abdominal tenderness. There is no guarding.  Musculoskeletal:        General: No swelling or tenderness.     Cervical back: Normal range of motion.  Skin:    General: Skin is warm and dry.     Findings: No erythema or rash.  Neurological:      General: No focal deficit present.     Mental Status: He is alert and oriented to person, place, and time.     GCS: GCS eye subscore is 4. GCS verbal subscore is 5. GCS motor subscore is 6.     Cranial Nerves: No cranial nerve deficit, dysarthria or facial asymmetry.     Sensory: No sensory deficit.     Motor: No weakness or tremor.     Coordination: Coordination normal. Finger-Nose-Finger Test normal.     Gait: Gait normal.     ED Results / Procedures / Treatments   Labs (all labs ordered are listed, but only abnormal results are displayed) Labs Reviewed  CBC WITH DIFFERENTIAL/PLATELET  COMPREHENSIVE METABOLIC PANEL    EKG EKG Interpretation  Date/Time:  Thursday Mar 21 2020 21:19:57 EDT Ventricular Rate:  64 PR Interval:    QRS Duration: 111 QT Interval:  387 QTC Calculation: 400 R Axis:   51 Text Interpretation: Sinus rhythm ST elev, probable normal early repol pattern No previous ECGs available Confirmed by Alvira Monday ([id number]) on 03/21/2020 10:39:42 PM   Radiology No results found.  Procedures Procedures (including critical care time)  Medications Ordered in ED Medications  levETIRAcetam (KEPPRA) IVPB 1000 mg/100 mL premix (0 mg Intravenous Stopped 03/21/20 2237)    ED Course  I have reviewed the triage vital signs and the nursing notes.  Pertinent labs & imaging results that were available during my care of the patient were reviewed by me and considered in my medical decision making (see chart for details).    MDM Rules/Calculators/A&P                      19yo male with history of bipolar disorder, seizures, asthma, homelessness, presents with concern for seizure.  Seizure in setting of being off of medications, he has rx but has not picked it up-CM was consulted last visit for medication assistance.  No significant electrolyte abnormalities.  No sign of trauma. He has returned to baseline. Given keppra 1g in ED.  Recommend medication compliance, counseled  in cessation of polysubstance abuse. Patient discharged in stable condition with understanding of reasons to return.    Final Clinical Impression(s) / ED Diagnoses Final diagnoses:  Seizure [facility])    Rx / DC Orders ED Discharge Orders    None       Alvira Monday, MD 03/22/20 1356

## 2020-03-22 ENCOUNTER — Emergency Department (HOSPITAL_COMMUNITY)
Admission: EM | Admit: 2020-03-22 | Discharge: 2020-03-22 | Disposition: A | Discharge status: Home / Self Care | Payer: Medicaid - Out of State | Attending: Emergency Medicine | Admitting: Emergency Medicine

## 2020-03-22 ENCOUNTER — Other Ambulatory Visit: Payer: Self-pay

## 2020-03-22 ENCOUNTER — Encounter (HOSPITAL_COMMUNITY): Payer: Self-pay | Admitting: Emergency Medicine

## 2020-03-22 DIAGNOSIS — Z5321 Procedure and treatment not carried out due to patient leaving prior to being seen by health care provider: Secondary | ICD-10-CM | POA: Diagnosis not present

## 2020-03-22 DIAGNOSIS — R569 Unspecified convulsions: Secondary | ICD-10-CM | POA: Insufficient documentation

## 2020-03-22 NOTE — ED Triage Notes (Signed)
Pt reports has seizures and needs to have a check up so can get a set up with a PCP. Supposed to take Kepra.

## 2020-03-29 ENCOUNTER — Encounter (HOSPITAL_COMMUNITY): Payer: Self-pay

## 2020-03-29 ENCOUNTER — Other Ambulatory Visit: Payer: Self-pay

## 2020-03-29 DIAGNOSIS — R569 Unspecified convulsions: Secondary | ICD-10-CM | POA: Diagnosis present

## 2020-03-29 DIAGNOSIS — J45909 Unspecified asthma, uncomplicated: Secondary | ICD-10-CM | POA: Diagnosis not present

## 2020-03-29 DIAGNOSIS — Z79899 Other long term (current) drug therapy: Secondary | ICD-10-CM | POA: Insufficient documentation

## 2020-03-29 DIAGNOSIS — F17210 Nicotine dependence, cigarettes, uncomplicated: Secondary | ICD-10-CM | POA: Insufficient documentation

## 2020-03-29 DIAGNOSIS — Z9114 Patient's other noncompliance with medication regimen: Secondary | ICD-10-CM | POA: Diagnosis not present

## 2020-03-29 NOTE — ED Triage Notes (Signed)
Pt BIB [facility] after a reported seizure at [facility]. Hx of same. EMS reports pseudoseizure activity. A&Ox4. Ambulatory.

## 2020-03-30 ENCOUNTER — Telehealth: Payer: Self-pay

## 2020-03-30 ENCOUNTER — Emergency Department (HOSPITAL_COMMUNITY)
Admission: EM | Admit: 2020-03-30 | Discharge: 2020-03-30 | Disposition: A | Discharge status: Home / Self Care | Payer: Medicaid - Out of State | Attending: Emergency Medicine | Admitting: Emergency Medicine

## 2020-03-30 DIAGNOSIS — F1910 Other psychoactive substance abuse, uncomplicated: Secondary | ICD-10-CM

## 2020-03-30 DIAGNOSIS — R569 Unspecified convulsions: Secondary | ICD-10-CM

## 2020-03-30 LAB — CBC WITH DIFFERENTIAL/PLATELET
Abs Immature Granulocytes: 0.02 10*3/uL (ref 0.00–0.07)
Basophils Absolute: 0.1 10*3/uL (ref 0.0–0.1)
Basophils Relative: 1 %
Eosinophils Absolute: 0.4 10*3/uL (ref 0.0–0.5)
Eosinophils Relative: 5 %
HCT: 43.3 % (ref 39.0–52.0)
Hemoglobin: 13.8 g/dL (ref 13.0–17.0)
Immature Granulocytes: 0 %
Lymphocytes Relative: 37 %
Lymphs Abs: 2.9 10*3/uL (ref 0.7–4.0)
MCH: 28.5 pg (ref 26.0–34.0)
MCHC: 31.9 g/dL (ref 30.0–36.0)
MCV: 89.3 fL (ref 80.0–100.0)
Monocytes Absolute: 0.8 10*3/uL (ref 0.1–1.0)
Monocytes Relative: 10 %
Neutro Abs: 3.6 10*3/uL (ref 1.7–7.7)
Neutrophils Relative %: 47 %
Platelets: 204 10*3/uL (ref 150–400)
RBC: 4.85 MIL/uL (ref 4.22–5.81)
RDW: 13.2 % (ref 11.5–15.5)
WBC: 7.6 10*3/uL (ref 4.0–10.5)
nRBC: 0 % (ref 0.0–0.2)

## 2020-03-30 LAB — RAPID URINE DRUG SCREEN, HOSP PERFORMED
Amphetamines: NOT DETECTED
Barbiturates: NOT DETECTED
Benzodiazepines: NOT DETECTED
Cocaine: NOT DETECTED
Opiates: NOT DETECTED
Tetrahydrocannabinol: POSITIVE — AB

## 2020-03-30 LAB — URINALYSIS, ROUTINE W REFLEX MICROSCOPIC
Bilirubin Urine: NEGATIVE
Glucose, UA: NEGATIVE mg/dL
Hgb urine dipstick: NEGATIVE
Ketones, ur: NEGATIVE mg/dL
Leukocytes,Ua: NEGATIVE
Nitrite: NEGATIVE
Protein, ur: NEGATIVE mg/dL
Specific Gravity, Urine: 1.027 (ref 1.005–1.030)
pH: 5 (ref 5.0–8.0)

## 2020-03-30 LAB — COMPREHENSIVE METABOLIC PANEL
ALT: 26 U/L (ref 0–44)
AST: 23 U/L (ref 15–41)
Albumin: 3.9 g/dL (ref 3.5–5.0)
Alkaline Phosphatase: 58 U/L (ref 38–126)
Anion gap: 7 (ref 5–15)
BUN: 15 mg/dL (ref 6–20)
CO2: 26 mmol/L (ref 22–32)
Calcium: 8.9 mg/dL (ref 8.9–10.3)
Chloride: 106 mmol/L (ref 98–111)
Creatinine, Ser: 0.8 mg/dL (ref 0.61–1.24)
GFR calc Af Amer: >60 mL/min (ref >60)
GFR calc non Af Amer: >60 mL/min (ref >60)
Glucose, Bld: 93 mg/dL (ref 70–99)
Potassium: 3.9 mmol/L (ref 3.5–5.1)
Sodium: 139 mmol/L (ref 135–145)
Total Bilirubin: 0.9 mg/dL (ref 0.3–1.2)
Total Protein: 6.6 g/dL (ref 6.5–8.1)

## 2020-03-30 LAB — CBG MONITORING, ED: Glucose-Capillary: 85 mg/dL (ref 70–99)

## 2020-03-30 MED ORDER — LEVETIRACETAM 500 MG PO TABS
500.0000 mg | ORAL_TABLET | Freq: Two times a day (BID) | ORAL | 1 refills | Status: AC
Start: 2020-03-30 — End: ?

## 2020-03-30 MED ORDER — LEVETIRACETAM IN NACL 1000 MG/100ML IV SOLN
1000.0000 mg | Freq: Once | INTRAVENOUS | Status: AC
  Administered 2020-03-30: 1000 mg via INTRAVENOUS
  Filled 2020-03-30: qty 100

## 2020-03-30 NOTE — ED Provider Notes (Signed)
TIME SEEN: 3:10 AM  CHIEF COMPLAINT: Seizure  HPI: Patient is a 19 year old male with history of asthma, autism, bipolar disorder, seizures, substance abuse, medical noncompliance who presents to the emergency department with seizures today.  States he thinks he has had at least 3.  He is supposed to be on Keppra but is not able to afford this medication.  States that he has [employer] but is in [place] where his mother lives.  He states he has been using cocaine and marijuana recently.  Reports drinking alcohol occasionally.  No fevers, cough, vomiting, diarrhea, pain.  ROS: See HPI Constitutional: no fever  Eyes: no drainage  ENT: no runny nose   Cardiovascular:  no chest pain  Resp: no SOB  GI: no vomiting GU: no dysuria Integumentary: no rash  Allergy: no hives  Musculoskeletal: no leg swelling  Neurological: no slurred speech ROS otherwise negative  PAST MEDICAL HISTORY/PAST SURGICAL HISTORY:  Past Medical History:  Diagnosis Date  . Asthma   . Autism   . Bipolar I disorder, single manic episode, severe, with psychosis ([place])   . Seizures ([place])     MEDICATIONS:  Prior to Admission medications   Medication Sig Start Date End Date Taking? Authorizing Provider  levETIRAcetam (KEPPRA) 500 MG tablet Take 1 tablet (500 mg total) by mouth 2 (two) times daily. Patient not taking: Reported on 03/21/2020 02/25/20 03/26/20  Lennice Sites, DO    ALLERGIES:  Allergies  Allergen Reactions  . Geodon [Ziprasidone Hcl] Other (See Comments)    Muscle spasm     SOCIAL HISTORY:  Social History   Tobacco Use  . Smoking status: Current Every Day Smoker    Packs/day: 1.00    Years: 10.00    Pack years: 10.00    Types: Cigarettes  . Smokeless tobacco: Never Used  Substance Use Topics  . Alcohol use: Yes    Comment: occasionally     FAMILY HISTORY: History reviewed. No pertinent family history.  EXAM: BP 113/81 (BP Location: Left Arm)   Pulse (!) 116    Temp 98.3 F (36.8 C)   Resp 14   SpO2 98%  CONSTITUTIONAL: Alert and oriented and responds appropriately to questions.  Poorly cooperative with exam and pulls the blanket over his head repeatedly while I am trying to interview him and perform examination.  HEAD: Normocephalic, appears atraumatic EYES: Conjunctivae clear, pupils appear equal, EOM appear intact ENT: normal nose; moist mucous membranes NECK: Supple, normal ROM CARD: Regular and tachycardic; S1 and S2 appreciated; no murmurs, no clicks, no rubs, no gallops RESP: Normal chest excursion without splinting or tachypnea; breath sounds clear and equal bilaterally; no wheezes, no rhonchi, no rales, no hypoxia or respiratory distress, speaking full sentences ABD/GI: Normal bowel sounds; non-distended; soft, non-tender, no rebound, no guarding, no peritoneal signs, no hepatosplenomegaly BACK:  The back appears normal EXT: Normal ROM in all joints; no deformity noted, no edema; no cyanosis SKIN: Normal color for age and race; warm; no rash on exposed skin NEURO: Moves all extremities equally, normal speech, no facial asymmetry, poorly cooperative with exam PSYCH: Agitated.  MEDICAL DECISION MAKING: Patient here with recurrent seizures.  Has been here frequently for the same.  Noncompliant with medications as he states he cannot afford them but is able to purchase cocaine and marijuana which he uses regularly.  Discussed with patient that he needs to take his Keppra as prescribed as recurrent seizures can be very dangerous and he needs to  avoid polysubstance abuse.  I have offered to send a prescription to a pharmacy in [place] so that his mother can pick this up and mail it to him or printed prescription for Keppra so that he can mail it to his mother to have the prescription filled.  He states "that is going to take too damn long".  Will place social work consultation to help with PCP, neurology outpatient follow-up as well as financial  resources to help him afford his medications although I suspect ultimately patient will continue to be medically noncompliant and continues to abuse illicit drugs.  Will obtain screening labs today.  He has no infectious symptoms.  He appears to be neurologically intact but is poorly cooperative with my exam.  Does not appear postictal at this time and I do not appreciate any signs of injury on exam.  Will load with IV Keppra today.  ED PROGRESS: Patient's labs, urine reviewed/interpreted and show no acute abnormality other than drug screen being positive for THC.  No further seizure-like activity witnessed that he has been in the emergency department for 10 hours.  Will discharge with refill of Keppra and outpatient neurology follow-up.  Social work consultation has been placed to help him establish outpatient care and financial assistance for his prescriptions.   At this time, I do not feel there is any life-threatening condition present. I have reviewed, interpreted and discussed all results (EKG, imaging, lab, urine as appropriate) and exam findings with patient/family. I have reviewed nursing notes and appropriate previous records.  I feel the patient is safe to be discharged home without further emergent workup and can continue workup as an outpatient as needed. Discussed usual and customary return precautions. Patient/family verbalize understanding and are comfortable with this plan.  Outpatient follow-up has been provided as needed. All questions have been answered.    EKG Interpretation  Date/Time:  Saturday March 30 2020 04:19:59 EDT Ventricular Rate:  42 PR Interval:    QRS Duration: 108 QT Interval:  457 QTC Calculation: 382 R Axis:   25 Text Interpretation: Sinus bradycardia Borderline Q waves in lateral leads ST elev, probable normal early repol pattern No significant change since last tracing Confirmed by Rochele Raring [phone]) on 03/30/2020 4:31:10 AM       Oliva Bustard was  evaluated in Emergency Department on 03/30/2020 for the symptoms described in the history of present illness. He was evaluated in the context of the global COVID-19 pandemic, which necessitated consideration that the patient might be at risk for infection with the SARS-CoV-2 virus that causes COVID-19. Institutional protocols and algorithms that pertain to the evaluation of patients at risk for COVID-19 are in a state of rapid change based on information released by regulatory bodies including the CDC and federal and state organizations. These policies and algorithms were followed during the patient's care in the ED.      Venda Dice, Layla Maw, DO 03/30/20 [phone]

## 2020-03-30 NOTE — Telephone Encounter (Signed)
Called patient on home number to give him information regarding PCP and medication management. Emergency contact sarah answered the phone. Stated she would give him my number to call me back.

## 2020-03-30 NOTE — Discharge Instructions (Addendum)
Your labs and urine showed no acute abnormality.  I recommend you stop smoking marijuana and snorting cocaine.  Please take your Keppra as prescribed.  We have placed a consult to social work to help establish primary care and neurology follow-up and to help you afford your Keppra.  I have provided you with a coupon for your prescription.  At most pharmacies, a month supply of Keppra is less than $20.  Steps to find a Primary Care Provider (PCP):  Call [phone] or [phone] to access "[facility] Find a Doctor Service."  2.  You may also go on the [facility] website at [email or web address]  3.  [facility] also frequently accepts new patients.  [facility]  [address] [place] [address] [phone]  4.  There are also multiple [facility], [facility] and [facility] practices throughout [place] that are frequently accepting new patients. You may find a clinic that is close to your home and contact them.  [facility] [email or web address] [phone]  [facility] [email or web address]  [facility] [email or web address] [phone]  [facility] [email or web address] [phone]  5.  Local Health Departments also can provide primary care services.  [facility]  [address] [place] [address] [phone]  [facility] Department [address] [place] [address] [phone]  [facility] Department [address] [place] [address]  [place] [address] [phone]

## 2020-03-31 ENCOUNTER — Telehealth: Payer: Self-pay

## 2020-03-31 NOTE — Telephone Encounter (Signed)
Reached out again, called patient to  Assist with PCP needs and medicine. His girlfriend said she gave him the number yesterday. We will await him calling us if he wants to reach out.

## 2020-04-04 ENCOUNTER — Other Ambulatory Visit: Payer: Self-pay

## 2020-04-04 ENCOUNTER — Emergency Department (HOSPITAL_COMMUNITY)
Admission: EM | Admit: 2020-04-04 | Discharge: 2020-04-04 | Disposition: A | Discharge status: Home / Self Care | Payer: Medicaid - Out of State | Attending: Emergency Medicine | Admitting: Emergency Medicine

## 2020-04-04 ENCOUNTER — Encounter (HOSPITAL_COMMUNITY): Payer: Self-pay

## 2020-04-04 DIAGNOSIS — F840 Autistic disorder: Secondary | ICD-10-CM | POA: Diagnosis not present

## 2020-04-04 DIAGNOSIS — F17210 Nicotine dependence, cigarettes, uncomplicated: Secondary | ICD-10-CM | POA: Insufficient documentation

## 2020-04-04 DIAGNOSIS — R569 Unspecified convulsions: Secondary | ICD-10-CM | POA: Diagnosis present

## 2020-04-04 DIAGNOSIS — F1210 Cannabis abuse, uncomplicated: Secondary | ICD-10-CM | POA: Diagnosis not present

## 2020-04-04 DIAGNOSIS — J45909 Unspecified asthma, uncomplicated: Secondary | ICD-10-CM | POA: Insufficient documentation

## 2020-04-04 MED ORDER — LEVETIRACETAM 500 MG PO TABS
500.0000 mg | ORAL_TABLET | Freq: Once | ORAL | Status: AC
  Administered 2020-04-04: 500 mg via ORAL
  Filled 2020-04-04: qty 1

## 2020-04-04 NOTE — ED Notes (Signed)
Attempted IV. Unsuccessful times 1.

## 2020-04-04 NOTE — ED Triage Notes (Signed)
Pt arrives [facility] from a parking lot. PD was in the lot and witnessed the pt sit down on the ground and had seizure like activity. Pt has a hx of the same. Pt is non compliant with medication. Pt is homeless. Pt reports headache as an aura. Pt sat down when he felt the aura. Pt was not post-ictal with EMS. Pt hx of autism.

## 2020-04-04 NOTE — ED Provider Notes (Signed)
[facility] DEPT Provider Note   CSN: [id number] Arrival date & time: 04/04/20  0865     History Chief Complaint  Patient presents with  . Seizures    Alexander Jackson is a 19 y.o. male.  The history is provided by the patient.  Seizures Seizure activity on arrival: no   Seizure type:  Unable to specify Initial focality:  None Episode characteristics: abnormal movements   Return to baseline: yes   Severity:  Mild Timing:  Once Context: medical non-compliance   History of seizures: yes        Past Medical History:  Diagnosis Date  . Asthma   . Autism   . Bipolar I disorder, single manic episode, severe, with psychosis ([place])   . Seizures ([place])     There are no problems to display for this patient.   History reviewed. No pertinent surgical history.     History reviewed. No pertinent family history.  Social History   Tobacco Use  . Smoking status: Current Every Day Smoker    Packs/day: 1.00    Years: 10.00    Pack years: 10.00    Types: Cigarettes  . Smokeless tobacco: Never Used  Vaping Use  . Vaping Use: Never used  Substance Use Topics  . Alcohol use: Yes    Comment: occasionally   . Drug use: Yes    Types: Marijuana    Home Medications Prior to Admission medications   Medication Sig Start Date End Date Taking? Authorizing Provider  levETIRAcetam (KEPPRA) 500 MG tablet Take 1 tablet (500 mg total) by mouth 2 (two) times daily. 02/25/20 03/30/20  Laurita Peron, DO  levETIRAcetam (KEPPRA) 500 MG tablet Take 1 tablet (500 mg total) by mouth 2 (two) times daily. 03/30/20   Ward, Delice Bison, DO    Allergies    Geodon [ziprasidone hcl]  Review of Systems   Review of Systems  Constitutional: Negative for chills and fever.  HENT: Negative for ear pain and sore throat.   Eyes: Negative for pain and visual disturbance.  Respiratory: Negative for cough and shortness of breath.   Cardiovascular: Negative for chest pain and  palpitations.  Gastrointestinal: Negative for abdominal pain and vomiting.  Genitourinary: Negative for dysuria and hematuria.  Musculoskeletal: Negative for arthralgias and back pain.  Skin: Negative for color change and rash.  Neurological: Positive for seizures. Negative for syncope.  All other systems reviewed and are negative.   Physical Exam Updated Vital Signs BP 124/76 (BP Location: Left Arm)   Pulse (!) 47   Temp 97.8 F (36.6 C) (Oral)   Resp 20   SpO2 100%   Physical Exam Vitals and nursing note reviewed.  Constitutional:      Appearance: He is well-developed.  HENT:     Head: Normocephalic and atraumatic.  Eyes:     Extraocular Movements: Extraocular movements intact.     Conjunctiva/sclera: Conjunctivae normal.     Pupils: Pupils are equal, round, and reactive to light.  Cardiovascular:     Rate and Rhythm: Normal rate and regular rhythm.     Heart sounds: No murmur heard.   Pulmonary:     Effort: Pulmonary effort is normal. No respiratory distress.     Breath sounds: Normal breath sounds.  Abdominal:     Palpations: Abdomen is soft.     Tenderness: There is no abdominal tenderness.  Musculoskeletal:     Cervical back: Neck supple.  Skin:    General: Skin is  warm and dry.     Capillary Refill: Capillary refill takes less than 2 seconds.  Neurological:     General: No focal deficit present.     Mental Status: He is alert and oriented to person, place, and time.     Cranial Nerves: No cranial nerve deficit.     Sensory: No sensory deficit.     Motor: No weakness.     Coordination: Coordination normal.  Psychiatric:        Mood and Affect: Mood normal.     ED Results / Procedures / Treatments   Labs (all labs ordered are listed, but only abnormal results are displayed) Labs Reviewed - No data to display  EKG None  Radiology No results found.  Procedures Procedures (including critical care time)  Medications Ordered in ED Medications    levETIRAcetam (KEPPRA) tablet 500 mg (has no administration in time range)    ED Course  I have reviewed the triage vital signs and the nursing notes.  Pertinent labs & imaging results that were available during my care of the patient were reviewed by me and considered in my medical decision making (see chart for details).    MDM Rules/Calculators/A&P                          Alexander Jackson is a 19 year old male with history of seizures who presents to the ED after seizure episode.  Patient with normal vitals.  No fever.  Back to his baseline.  Noncompliant with his medication.  Given Keppra.  Normal neurological exam.  Overall appears to be breakthrough seizure in the setting of noncompliance.  Denies any alcohol or drug use.  Has not been able to fill his last prescription of Keppra.  Discharged in the ED in good condition.  Given return precautions.  This chart was dictated using voice recognition software.  Despite best efforts to proofread,  errors can occur which can change the documentation meaning.    Final Clinical Impression(s) / ED Diagnoses Final diagnoses:  Seizure [facility])    Rx / DC Orders ED Discharge Orders    None       Alexander Norfolk, DO 04/04/20 [id number]

## 2020-04-04 NOTE — ED Notes (Signed)
Per MD- No IV needed

## 2020-04-04 NOTE — ED Notes (Signed)
Pt has backpack at nurses station. Pt is homeless and has several large knives in backpack.

## 2020-05-11 ENCOUNTER — Other Ambulatory Visit: Payer: Self-pay

## 2020-05-11 ENCOUNTER — Emergency Department (HOSPITAL_COMMUNITY)
Admission: EM | Admit: 2020-05-11 | Discharge: 2020-05-11 | Disposition: A | Discharge status: Home / Self Care | Payer: Medicaid - Out of State | Attending: Emergency Medicine | Admitting: Emergency Medicine

## 2020-05-11 ENCOUNTER — Encounter (HOSPITAL_COMMUNITY): Payer: Self-pay

## 2020-05-11 DIAGNOSIS — R519 Headache, unspecified: Secondary | ICD-10-CM | POA: Insufficient documentation

## 2020-05-11 DIAGNOSIS — Z5321 Procedure and treatment not carried out due to patient leaving prior to being seen by health care provider: Secondary | ICD-10-CM | POA: Diagnosis not present

## 2020-05-11 NOTE — ED Triage Notes (Signed)
He was observed to be lying on a local sidewalk. Bystanders called EMS. They arrived to find the pt. Awake and oriented; and he told them he laid down "because I have a headache." he is in no distress.

## 2020-06-20 ENCOUNTER — Other Ambulatory Visit: Payer: Self-pay

## 2020-06-20 ENCOUNTER — Encounter (HOSPITAL_COMMUNITY): Payer: Self-pay | Admitting: *Deleted

## 2020-06-20 DIAGNOSIS — F1210 Cannabis abuse, uncomplicated: Secondary | ICD-10-CM | POA: Insufficient documentation

## 2020-06-20 DIAGNOSIS — R519 Headache, unspecified: Secondary | ICD-10-CM | POA: Insufficient documentation

## 2020-06-20 DIAGNOSIS — Z5321 Procedure and treatment not carried out due to patient leaving prior to being seen by health care provider: Secondary | ICD-10-CM | POA: Insufficient documentation

## 2020-06-20 DIAGNOSIS — R569 Unspecified convulsions: Secondary | ICD-10-CM | POA: Diagnosis not present

## 2020-06-20 NOTE — ED Triage Notes (Signed)
Pt states seizure today, reports use of marijuana. Last took his Keppra this morning. C/o headache. Alert and oriented.

## 2020-06-20 NOTE — ED Triage Notes (Signed)
Pt arrives via [facility] from [place] [facility]. Called 911, said he had a seizure, 911 communicator indicated they lost communication with patient and pt may have had another seizure while he was on the phone. On arrival by FD to scene, he  post ictal period that was short. Alert and oriented en route. Compliant with Keppra. Marijuana and meth use occasionally. 110/60, hr 88, 98%, cbg 141. Refused IV en route.

## 2020-06-21 ENCOUNTER — Ambulatory Visit (HOSPITAL_COMMUNITY)
Admission: EM | Admit: 2020-06-21 | Discharge: 2020-06-21 | Disposition: A | Discharge status: Home / Self Care | Payer: Medicaid - Out of State

## 2020-06-21 ENCOUNTER — Emergency Department (HOSPITAL_COMMUNITY)
Admission: EM | Admit: 2020-06-21 | Discharge: 2020-06-21 | Disposition: A | Discharge status: Home / Self Care | Payer: Medicaid - Out of State | Attending: Emergency Medicine | Admitting: Emergency Medicine

## 2020-06-21 NOTE — ED Provider Notes (Signed)
Behavioral Health Medical Screening Exam  Alexander Jackson is a 19 y.o. male.who came to [facility] voluntarily voicing he is homeless. The patient expressed he does have mental health problems. Initially, he was not able to fully provide the staff with his diagnosis. The patient contacted his mom while here, and he became anxious and is voicing to be discharged. He stated he wants to leave. The patient does not appear to be responding to internal or external stimuli. Neither is the patient presenting with any delusional thinking. The patient denies auditory or visual hallucinations. The patient denies any suicidal, homicidal, or self-harm ideations. The patient is not presenting with any psychotic or paranoid behaviors. During an encounter with the patient, he was able to answer questions appropriately.  Total Time spent with patient: 20 minutes  Psychiatric Specialty Exam  Presentation  General Appearance:Appropriate for Environment;Disheveled  Eye Contact:Fair  Speech:Clear and Coherent;Normal Rate;Pressured  Speech Volume:Increased  Handedness:Right   Mood and Affect  Mood:Anxious  Affect:Appropriate;Congruent   Thought Process  Thought Processes:Coherent;Goal Directed  Descriptions of Associations:Intact  Orientation:Full (Time, Place and Person)  Thought Content:Logical  Hallucinations:None  Ideas of Reference:None  Suicidal Thoughts:No  Homicidal Thoughts:No   Sensorium  Memory:Immediate Good;Recent Good;Remote Good  Judgment:Good  Insight:Fair   Executive Functions  Concentration:Good  Attention Span:Good  Recall:Good  Fund of Knowledge:Good  Language:Good   Psychomotor Activity  Psychomotor Activity:Normal   Assets  Assets:Financial Resources/Insurance;Housing;Social Support   Sleep  Sleep:Fair  Number of hours: 4   Physical Exam: Physical Exam ROS Blood pressure (!) 143/77, pulse 73, temperature 97.7 F (36.5 C), temperature  source Tympanic, resp. rate 18, height 5\' 11"  (1.803 m), weight 173 lb (78.5 kg), SpO2 100 %. Body mass index is 24.13 kg/m.  Musculoskeletal: Strength & Muscle Tone: within normal limits Gait & Station: normal Patient leans: N/A   Recommendations:  Based on my evaluation the patient does not appear to have an emergency medical condition.  The patient is not a safety risk to himself or others and does not require psychiatric inpatient admission for stabilization and treatment.   , NP 06/21/2020, 9:36 PM
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
## 2020-06-21 NOTE — Discharge Instructions (Signed)
Patient is requesting to be discharge. He voiced that he is homeless and is in need of list resources. He denies SI/HI/ AVH. He is recommended to follow-up with the information. Patient states he is going to try to return to [place] where his family is located.

## 2020-06-21 NOTE — ED Notes (Signed)
Pt. Belongings in locker 11

## 2022-02-21 IMAGING — CT CT HEAD W/O CM
3 series · 15 of 46 positions shown, 18 images · non-contrast
Comparison: August 22, 2019.

CLINICAL DATA: Seizure with apparent fall.  Headache.

EXAM:
CT HEAD WITHOUT CONTRAST
TECHNIQUE: Contiguous axial images were obtained from the base of the skull
through the vertex without intravenous contrast.

[Series 2: head wo · axial · 0.47mm/px · z∈[+1506,+1626]mm · 9 of 29 slices shown, 12 images]
[im 3/29  brain]
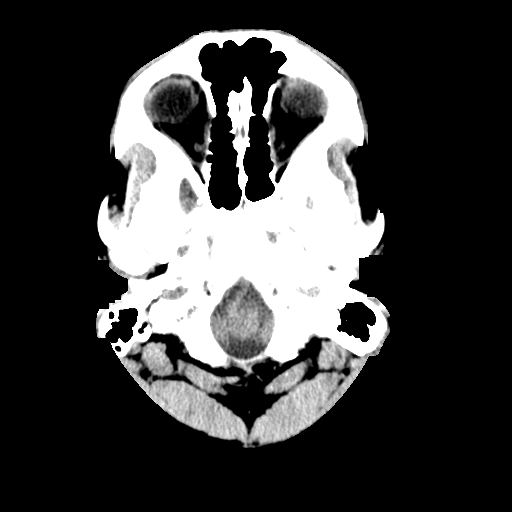
[im 3/29  bone]
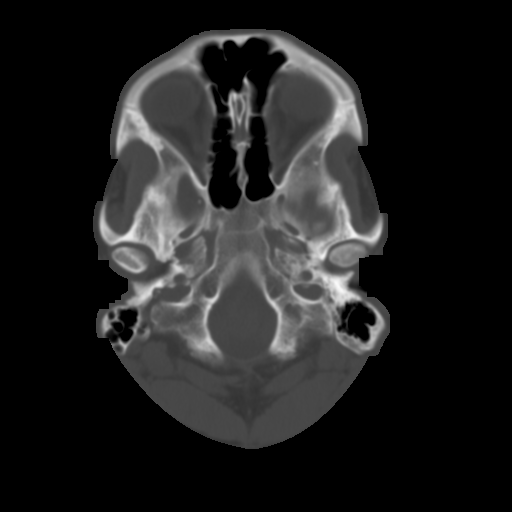
[im 6/29  brain]
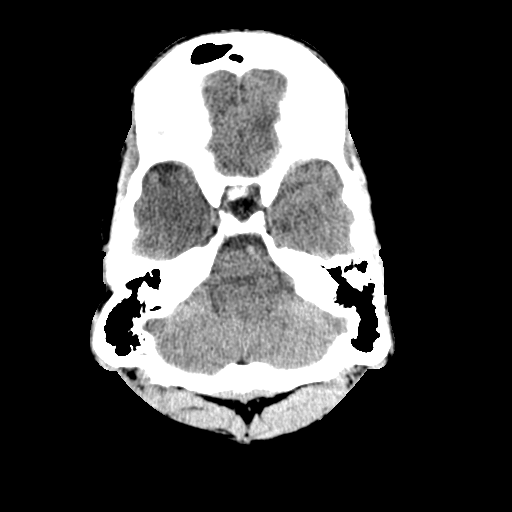
[im 9/29  brain]
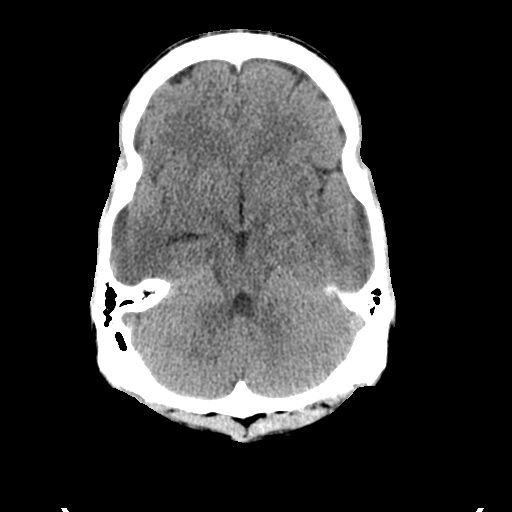
[im 12/29  brain]
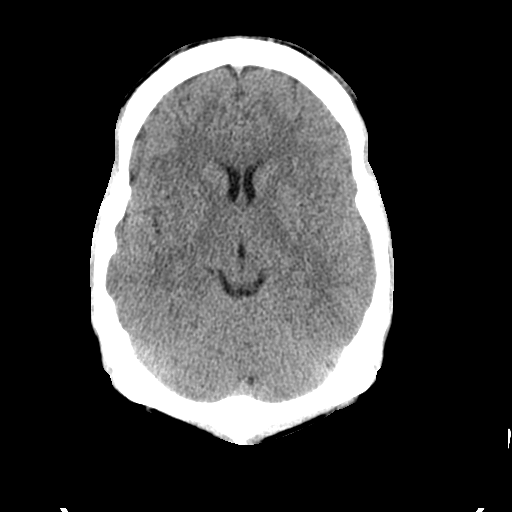
[im 15/29  brain]
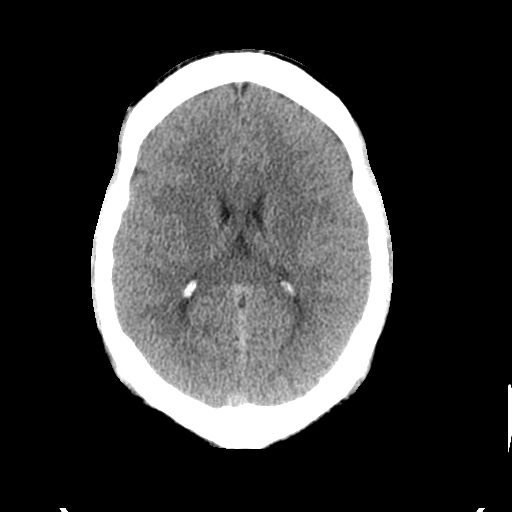
[im 15/29  bone]
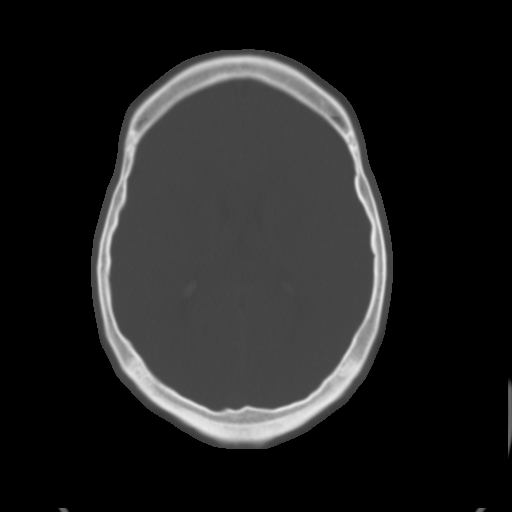
[im 18/29  brain]
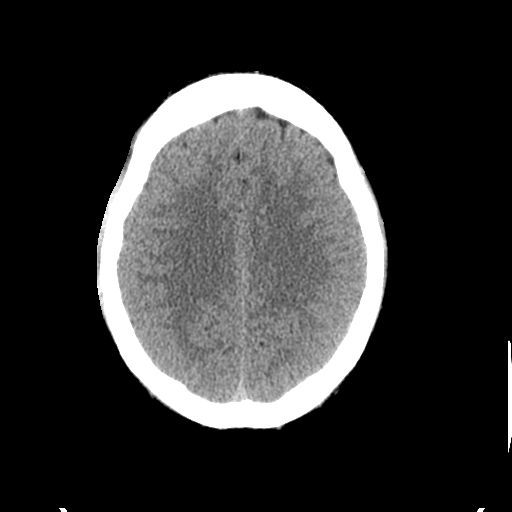
[im 21/29  brain]
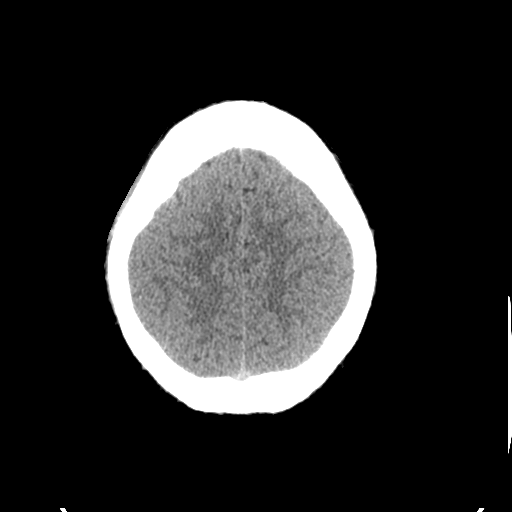
[im 24/29  brain]
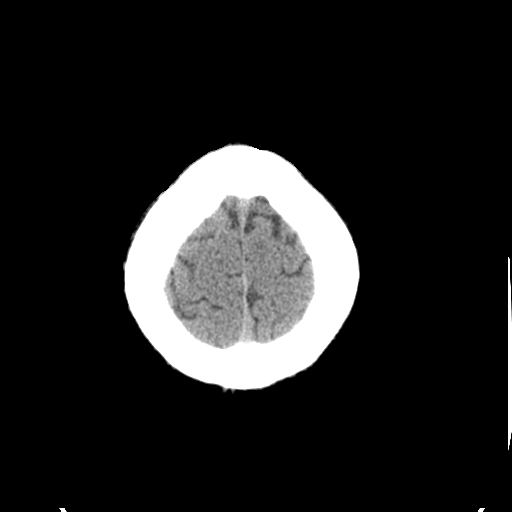
[im 27/29  brain]
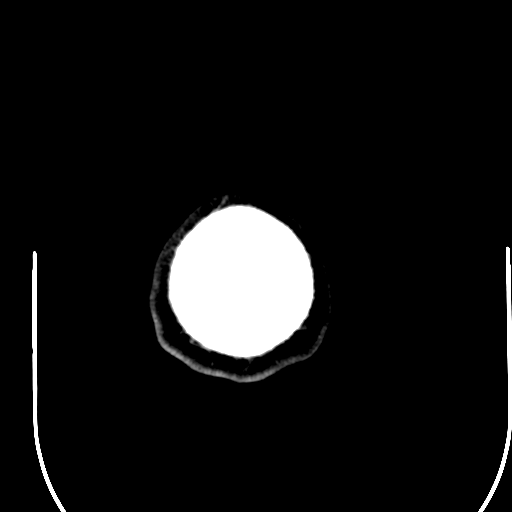
[im 27/29  bone]
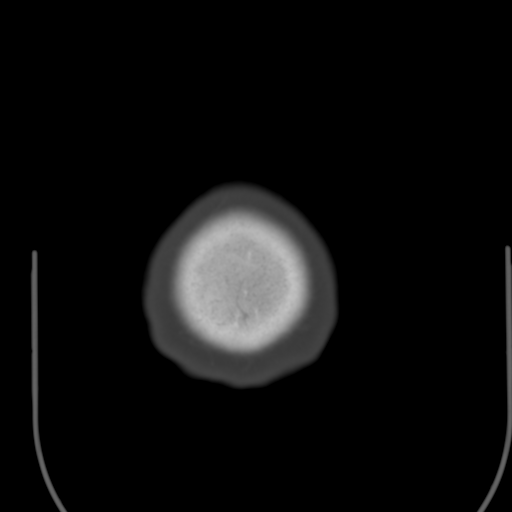

[Series 5: coronal soft tissue · coronal · 0.29mm/px · 3 of 73 slices shown]
[im 25/73  brain]
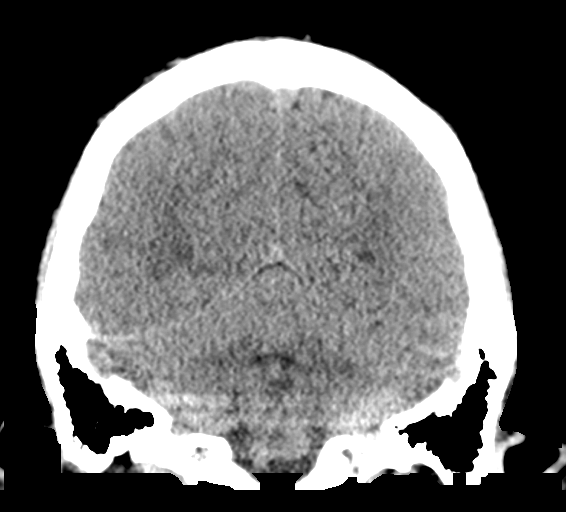
[im 33/73  brain]
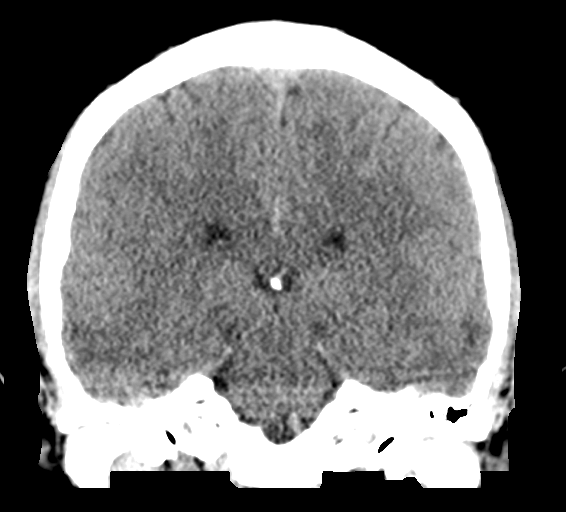
[im 41/73  brain]
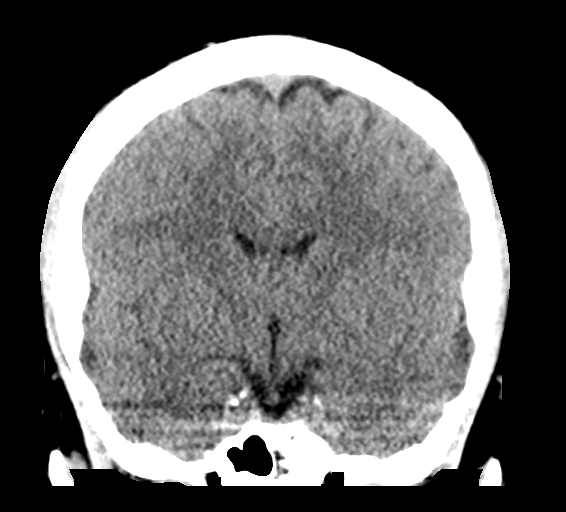

[Series 6: sagittal soft tissue · sagittal · 0.29mm/px · 3 of 56 slices shown]
[im 19/56  brain]
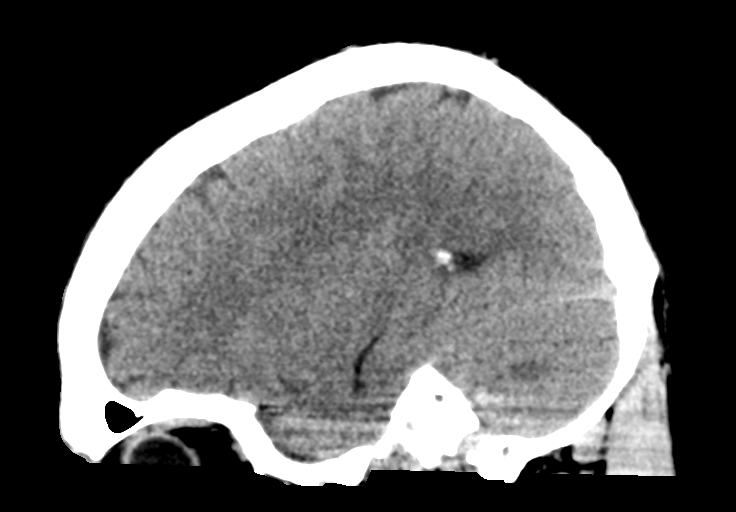
[im 28/56  brain]
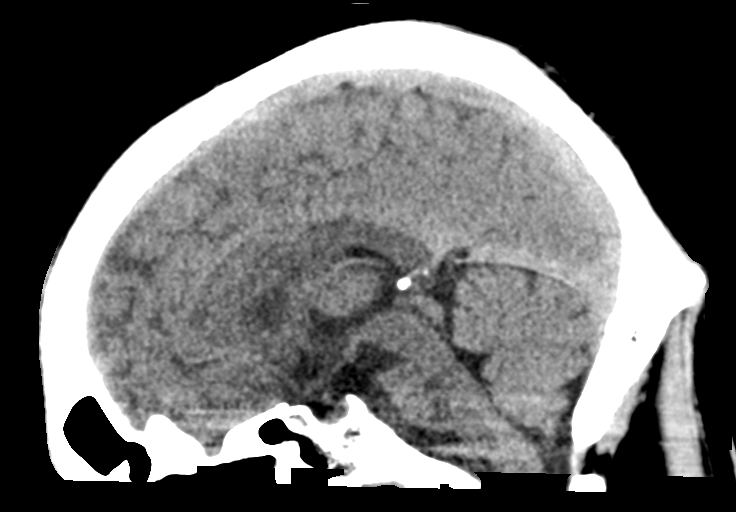
[im 37/56  brain]
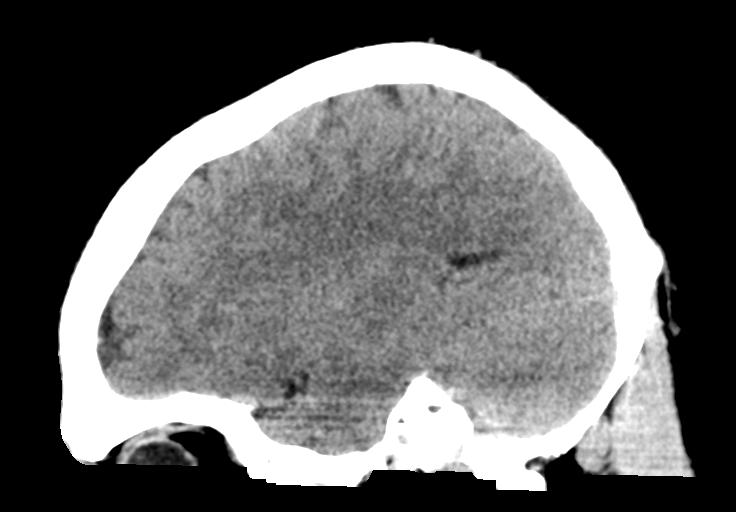

[15 of 46 positions shown; findings below may reference images not displayed]

FINDINGS: Brain: Ventricles and sulci are normal in size and configuration.
There is no intracranial mass, hemorrhage, extra-axial fluid
collection, or midline shift. The brain parenchyma appears
unremarkable. No demonstrable acute infarct.

Vascular: No hyperdense vessel. No vascular calcifications are
evident.

Skull: The bony calvarium appears intact.

Sinuses/Orbits: Visualized paranasal sinuses are clear. Visualized
orbits appear symmetric bilaterally.

Other: Mastoid air cells are clear.
IMPRESSION: Study within normal limits.

## 2023-02-08 ENCOUNTER — Inpatient Hospital Stay
Admit: 2023-02-08 | Discharge: 2023-02-09 | Disposition: A | Discharge status: Other Acute Inpatient Hospital | Payer: PRIVATE HEALTH INSURANCE | Source: Home / Self Care | Attending: Student in an Organized Health Care Education/Training Program

## 2023-02-08 DIAGNOSIS — F23 Brief psychotic disorder: Secondary | ICD-10-CM

## 2023-02-08 DIAGNOSIS — R569 Unspecified convulsions: Secondary | ICD-10-CM

## 2023-02-08 LAB — Electrolyte Panel: POTASSIUM: 3.3 mmol/L — ABNORMAL LOW (ref 3.6–5.3)

## 2023-02-08 LAB — CBC: RED BLOOD CELL COUNT: 4.81 10*6/uL (ref 4.41–5.95)

## 2023-02-08 LAB — Glucose,POC: GLUCOSE,POC: 95 mg/dL (ref 65–99)

## 2023-02-08 LAB — CREATININE

## 2023-02-08 LAB — Glucose, Whole Blood: GLUCOSE, WHOLE BLOOD: 88 mg/dL (ref 65–99)

## 2023-02-08 LAB — Expedited COVID-19 and Influenza A B PCR: INFLUENZA A PCR: NOT DETECTED

## 2023-02-08 MED ADMIN — NICOTINE 14 MG/24HR TD PT24: 14 mg | TRANSDERMAL | Stop: 2023-02-09 | NDC 46122035274

## 2023-02-08 MED ADMIN — ARIPIPRAZOLE 5 MG PO TABS: 10 mg | ORAL | @ 23:00:00 | Stop: 2023-02-08 | NDC 50268008811

## 2023-02-08 MED ADMIN — DROPERIDOL 2.5 MG/ML IJ SOLN: 5 mg | INTRAMUSCULAR | Stop: 2023-02-09

## 2023-02-08 MED ADMIN — LEVETIRACETAM 500 MG PO TABS: 500 mg | ORAL | @ 23:00:00 | Stop: 2023-02-08 | NDC 51079082101

## 2023-02-08 NOTE — ED Notes
Pt states he doesn't want to change out of his clothes because ''I have sensory issues with my autism''. Dr. Boneta Lucks aware.

## 2023-02-08 NOTE — ED Notes
Pt laying down and more calm at this time. Given warm blankets and pillow. Sitter at bedside d/t elopement risk.

## 2023-02-08 NOTE — ED Notes
Report given to Katie

## 2023-02-08 NOTE — ED Provider Notes
[facility]  Emergency Department Service Report    Triage     Jyair Kiraly, a 22 y.o. male, presents with Seizures (Tonic-clonic; at baseline at this time; hx of autism/)    Arrived on 02/08/2023 at 12:35 PM   Arrived by [facility] [[id number]]    ED Triage Vitals [02/08/23 1253]   Temp Temp Source BP Heart Rate Resp SpO2 O2 Device Pain Score Weight   37 ?C (98.6 ?F) Temporal 131/71 98 16 100 % None (Room air) Zero 85 kg (187 lb 6.3 oz)       Allergies   Allergen Reactions    Ziprasidone         Initial Physician Contact       Comprehensive Exam Initiated  Contact Date: 02/08/23  Contact Time: 1540    History   HPI    Jeromie Gainor is a 22 y.o. male, brought in by EMS, with a history of autism, epilepsy, bipolar disorder, psychosis, and PTSD, presenting S/P seizure, onset before arrival. Patient describes having a witnessed seizure today. Patient states that his mental health is not well and just moved to [facility] from [place]. Patient endorses recent auditory hallucinations. Patient endorses daily tobacco use and occasional methamphetamine use. Patient reports last taking his Keppra 500 mg BID 3-4 weeks ago. Patient reports a history of Abilify 400 mg injections (x1 a month), stating that he is not sure when he received his last injection. Patient also notes a history of taking Trazodone. Patient denies alcohol use. Patient denies any suicidal ideations, homicidal ideations, visual hallucinations, fever, nausea, or vomiting.                      No past medical history on file.     No past surgical history on file.     Past Family History   family history is not on file.     Past Social History   he has no history on file for tobacco use, alcohol use, drug use, and sexual activity.       Physical Exam   Physical Exam  Vitals reviewed.   Constitutional:       Appearance: He is not toxic-appearing.      Comments: Mild distress   HENT:      Head: Normocephalic and atraumatic.   Eyes: Extraocular Movements: Extraocular movements intact.      Conjunctiva/sclera: Conjunctivae normal.      Pupils: Pupils are equal, round, and reactive to light.   Cardiovascular:      Rate and Rhythm: Normal rate.      Pulses: Normal pulses.   Pulmonary:      Effort: Pulmonary effort is normal.   Abdominal:      General: Abdomen is flat.   Musculoskeletal:         General: No deformity or signs of injury.   Skin:     General: Skin is warm and dry.   Neurological:      Mental Status: He is alert. Mental status is at baseline.      Comments: (+) Voluntary flailing movements in bed. No tonic clonic activity.    Psychiatric:         Attention and Perception: He perceives auditory (Intermittently responding to internal stimuli.) hallucinations.         Mood and Affect: Mood is anxious.         Speech: Speech is tangential (But redirectable.).  Medical Decision Making   Ysmael Hires is a 22 y.o. male, brought in by EMS, with a history of autism, epilepsy, bipolar disorder, psychosis, and PTSD, presenting S/P seizure, onset before arrival. Given Pt's disorganization, I have strong concern that Pt is a meets criteria for grave disability due to a primary psychiatric disorder.  Will place the Pt on a temporary 8h medical hold and will consult psychiatry.  Seizure-like activity most likely due to medication noncompliance. I considered, but think less likely, secondary etiologies of epileptic seizures to include drug / toxin etiologies (ETOH, stimulants, medication side effects), metabolic disturbances (glucose, Na), acute CNS infections (meningitis, encephalitis, abscess), ICH / tumor / CVA.  Presentation not consistent with non-epileptic type seizure to include syncope, neurologic etiologies (vertebrobasilar insufficiency, movement disorder, migraine), impact seizure related to head trauma.    Plan: labs, seizure precautions, psychiatry, medical whole x 8 hr, reassess    Medical Decision Making  Problems Addressed:  Acute psychosis (HCC/RAF): acute illness or injury that poses a threat to life or bodily functions  Bipolar I disorder (HCC/RAF): chronic illness or injury with severe exacerbation, progression, or side effects of treatment  Seizure (HCC/RAF): acute illness or injury    Amount and/or Complexity of Data Reviewed  Labs: ordered.  Discussion of management or test interpretation with external provider(s): Psych c/s given patient likely danger to self and unable to take care of self    Risk  OTC drugs.  Prescription drug management.  Decision regarding hospitalization.             Progress Notes / Reassessments     ED Course as of 02/09/23 1134   Mon Feb 08, 2023   1550 Paged Psych. [MS]   1618 Psych to see, patient with increasing agitation, plan for 5 IM droperidol [JK]   1839 Discussed with psych, plan for hold [JK]   1841 Place Keppra 500 mg b.i.d. for seizure prophylaxis [JK]      ED Course User Index  [JK] Kloor, Leo Rod., MD  [MS] Lurena Nida              ED Course      Laboratory Results     Labs Reviewed   CBC - Abnormal; Notable for the following components:       Result Value    White Blood Cell Count 12.16 (*)     Hemoglobin 13.2 (*)     All other components within normal limits   ELECTROLYTE PANEL - Abnormal; Notable for the following components:    Potassium 3.3 (*)     All other components within normal limits   GLUCOSE - Normal   POCT GLUCOSE - Normal   EXPEDITED COVID-19 AND INFLUENZA A B PCR, RESPIRATORY UPPER    Narrative:     This test is intended for in vitro diagnostic use under FDA Emergency Use Authorization only. Results are for the presumptive identification of COVID-19, Influenza A, and Influenza B RNA. The  Clinical Laboratory is certified under the Clinical Laboratory Improvement Amendments of 1988 (CLIA-88) as qualified to perform high complexity clinical laboratory testing.   CREATININE,WHOLE BLOOD       Imaging Results     No orders to display       Consults Consult Orders Placed This Encounter       Ordered     Status .    02/08/23 1550  Consult/Autopage to Psychiatry [Adult & Pediatrics]  Once  Provider:  (Not yet assigned)    Completed             Clnical Impression        1. Seizure (HCC/RAF)    2. Acute psychosis (HCC/RAF)    3. Bipolar I disorder (HCC/RAF)    4. Autism spectrum disorder           Disposition and Follow-up   Disposition: Admit to Psych [14]     No future appointments.    Follow up with:  No follow-up provider specified.    Return precautions are specified on After Visit Summary.    Discharge Medication List as of 02/08/2023  9:01 PM          Medications Administered This Encounter           Status .     levETIRAcetam tab 500 mg  STAT         Last MAR action: Given      levETIRAcetam tab 500 mg  STAT         Last MAR action: Given      ARIPiprazole tab 10 mg  Once         Last MAR action: Given               Scribe Signature   I, Lurena Nida, have acted as a Stage manager for patient Djibril Glogowski on behalf of Dr. Boneta Lucks at 02/08/2023 at 3:37 PM. All documentation underwent a comprehensive review by the listed physician(s) and received their approval upon signing.            Kloor, Leo Rod., MD  Resident  02/08/23 2226    ED Attending Attestation  I was present with the resident during the key/critical portions of this service.  I have discussed the management with the resident, have reviewed the resident note and agree with the documented findings and plan of care.    Ma Rings, MD, [facility]  [facility] Assistant Clinical Professor  [facility] Department of Emergency Medicine  Email: [email or web address] ~ Pager: [phone]         Gaylyn Rong., MD  02/09/23 1134

## 2023-02-08 NOTE — ED Notes
Ashok Cordia (mom) [phone]    Melvin Robles

## 2023-02-09 ENCOUNTER — Ambulatory Visit: Payer: PRIVATE HEALTH INSURANCE

## 2023-02-09 DIAGNOSIS — F319 Bipolar disorder, unspecified: Secondary | ICD-10-CM

## 2023-02-09 DIAGNOSIS — F840 Autistic disorder: Secondary | ICD-10-CM

## 2023-02-09 MED ADMIN — LEVETIRACETAM 500 MG PO TABS: 500 mg | ORAL | @ 03:00:00 | Stop: 2023-02-09 | NDC 51079082101

## 2023-02-09 NOTE — Other
[facility] [place]  PATIENT/GUARDIAN/PARENT CONSENT TO TREATMENT WITH SPECIFIED MEDICATIONS    Author met with patient, conservator, or parent to discuss the patient's condition which requires treatment. Author has recommended treatment of this illness or condition with psychotropic medications. The patient has been provided with the following information by author prescribing such medications:  The nature of the mental illness/condition  The reasons for taking such medications, including the likelihood of improving or not improving without such medication, and that patient's consent, once given, may be withdrawn at any time by patient stating such intention to any member of the treating staff.  The reasonable alternative treatment available, if any.  The type, frequency, amount, method (oral or injection), and duration of medication treatment.  The side effects that this/these particular medication(s) may cause, as well as side effects which may occur because of physical or medical condition(s) that the patient may have or interaction with other medications or foods. If neuroleptics have been prescribed, Thereasa Parkin has discussed with patient the possible complication of tardive dyskinesia, its symptoms and implications.  The possible consequences of abrupt discontinuation of this medication and ways to avoid these consequences.  The possible additional side effects which may occur when taking such medication beyond three months.  The patient has the right to request and be provided whatever additional information they desire.    The patient/parent/guardian provided verbal consent that: (1) they agreed to the foregoing; (2) the medications and treatment set forth above have been adequately explained and/or discussed with with patient/parent/guardian by Thereasa Parkin, and they have received all of the information they desire concerning such medication and treatment; and (3) they authorize and consent to the administration of such medications and treatment.    Patient's/Guardian's/Parents Name: (Print First and Last Name)  Melvin Robles Patient's/Guardian/Parent's Signature:  Verbal consent provided Date:  02/08/2023 Time:  6:59 PM   Physician's Name: (Print First and Last Name)  Barry Dienes. Eureka Valdes Physician's Signature:  Nurse, mental health by Barry Dienes. Rosela Supak Date:  02/08/2023 Time:  6:59 PM   *Witness' Name: (Print First and Last Name) Witness' Signature: Date: Time:   * RN signature required for telephonic consent only.

## 2023-02-09 NOTE — ED Notes
Pt sleeping. RR even and unlabored, in NAD. Care continued. Sitter to bedside.
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
## 2023-02-09 NOTE — ED Notes
Security called to search and secure pt belongings. Pt okay to stay in street clothes.

## 2023-02-09 NOTE — Other
[facility] Department of Mental Health - MH 302 NCR                                                 Department of Health Care Services  [place]                                       Health and Hospital doctor   APPLICATION FOR ASSESSMENT, EVALUATION, AND CRISIS INTERVENTION OR PLACEMENT FOR EVALUATION AND TREATMENT  Confidential Client/Patient Information  See [place] W&I Code, Section 5328 and HIPAA Privacy Rule 45 C.F.R. ? 69.508  Welfare and Institutions Code (W&I Code), Section 5150(f) and (g), require that each person, when first detained for psychiatric evaluation, be given certain specific information orally and a record be kept of the advisement by the evaluating facility. DETAINMENT ADVISEMENT  My name is ________________________  I am a (peace officer/mental health professional) with (name of agency).  You are not under criminal arrest, but I am taking you for examination by mental health professionals at (name of facility).     You will be told your rights by the mental health staff.     If taken into custody at his or her residence, the person shall also be told the following information:       [X]  Advisement Complete        [  ] Advisement Incomplete      You may bring a few personal items with you, which I will have to approve. Please inform me if you need assistance turning off any appliance or water. You may make a phone call and leave a note to tell your friends or family where you have been taken.   Good Cause for Incomplete Advisement:                                                                                               Advisement Completed By:  Barry Dienes. Alessio Bogan        Position:   Resident physician        Language or Modality Used:  Spoken English        Date of Advisement:  02/08/2023          To (name of 5150 designated facility): [facility]    Application is hereby made for the assessment and evaluation of Melvin Robles     residing at [address] [place], [place], [address], [place], for up to 72-hour assessment, evaluation, and crisis intervention or placement for evaluation and treatment at a designated facility pursuant to Section 5150, et seq. (adult) or Section 5585 et seq. (minor), of the W&I Code. If a minor, authorization for voluntary treatment is not available and to the best of my knowledge, the legally responsible party appears to be/is: (Check one):  []  Parent;  []   Legal Guardian;  []  Conservator;  []  Juvenile Court under W&I Code 300;  []  Juvenile Court under W&I Code 601/602.      If known, provide names, address and telephone numbers in area provided below:   N/A          The above person?s condition was called to my attention under the following circumstances:    [facility], consultation to Psychiatry     [ ]  Amended 5150, original scanned in EHR        I have probable cause to believe that the person is, as a result of a mental health disorder, a danger to others or to himself/herself, or gravely disabled because: (state specific facts):   Patient with known history of Bipolar 1 disorder, Autism, PTSD, Epilepsy, brought to [facility] with AMS after witnessed seizure earlier today, reporting recent psychotic symptoms including command auditory hallucinations, unable to articulate a plan for self-care (access to food, shelter, clothing) outside hospital beyond conveyingdesire to return home to [place].        (CONTINUED ON NEXT PAGE)   Reference: DHCS 1801 (06/18)                                                                                                                     Page 1 of 4  Original Application: Sales executive to Assessment, Evaluation, and Crisis Intervention Location or 5150/5585 Designated Facility   [facility] Department of Mental Health - MH 302 NCR                                                 Department of Health Care Services  [place]                                       Health and Health and safety inspector Agency            CLIENT NAME: Melvin Robles   APPLICATION FOR 72 HOUR DETENTION FOR EVALUATION AND TREATMENT (CONTINUED)     Historical course of the person?s mental disorder:  [X]  I have considered the historical course of the person?s mental disorder: [Includes evidence presented by service/support provider, family member(s), and person subject to probable cause determination or designee.]  Per above, patient has a documented history of primary psychiatric disorder which may plausibly be contributing to current presentation.    [  ] No reasonable bearing on determination  [  ] No information available because: N/A       History Provided by (Name) Address Phone Number Relation                  Based upon the above information, there is probable cause to believe that said person is, as a result  of mental health disorder:     [  ] A danger to himself/herself.  [  ] A danger to others.        [X]  Gravely disabled adult.  [  ] Gravely disabled minor.        Minors only: []  Based upon the above information, it appears that there is probable cause to believe that authorization for voluntary treatment is not available.   Signature, title, and badge number of Tax adviser, professional person in charge of the facility designated by the county for evaluation and treatment, member of the attending staff, designated members of a mobile crisis team, or professional person designated by [employer].     Layla Maw Chavie Kolinski Badge/NPI#: Surveyor, quantity   (Electronically signed)      Date: 02/08/2023       Phone:  [phone]        Time: 6:36 PM         Name of Law Enforcement Agency or Evaluation Facility/Person:    [facility] Address of Sales executive or Evaluation Facility/Person:   [address] [place], [place], [place] [address]   For patients in Medical ER?s, detention began:  Date:  02/08/23  Time:  16:10 NOTIFICATIONS TO BE PROVIDED TO LAW ENFORCEMENT AGENCY     Notify (officer/unit & telephone #): N/A        NOTIFICATION OF PERSON?S RELEASE IS REQUESTED BY THE REFERRING PEACE OFFICER BECAUSE:   [  ] The person has been referred to the facility under circumstances which, based upon an allegation of facts regarding actions witnessed by the officer or another person, would support the filing of a criminal complaint.  []  Weapon was Engineer, drilling to Section 8102 W&I Code. Upon release, facility is required to provide notice to the person regarding the procedure to obtain return of any confiscated firearm pursuant to Section 8102 W&I Code.   Reference: [facility] 1801 (06/18)                                                                                                                     Page 2 of 4  Original Application: Accompany Client to Assessment, Evaluation, and Crisis Intervention Location or 5150/5585 Designated Facility   [facility] [employer] of Mental Health - MH 302 NCR                                                 Department of Health Care Services  [place]                                       Health and Health and safety inspector Agency   SEE SUBSEQUENT PAGES FOR DEFINITIONS AND REFERENCES  DEFINITIONS AND REFERENCES   ?Gravely Disabled? means a condition in which a person, as a result of a mental disorder, is unable to provided for his or her basic personal needs for food, clothing and shelter.  SECTION 5008(h) W&I Code    ?Gravely Disabled Minor? means a minor who, as a result of a mental disorder, is unable to use the elements of life which are essential to health, safety, and development, including food, clothing, and shelter, even though provided to the minor by others.  Intellectual disability, epilepsy, or other developmental disabilities, alcoholism, other drug abuse, or repeated antisocial behavior do not, by themselves, constitute a mental disorder.  SECTION 5585.25 W&I Code    ?Tax adviser? means a duly Music therapist as that term is defined in Chapter 4.5 (commencing with Section 830) of Title 3 of Part 2 of the Penal Code who has completed the basic training course established by the Commission on [employer], or any Civil Service fast streamer or probation officer specified in Section 830.5 of the Penal Code when acting in relation to cases for which he or she has a legally mandated responsibility. SECTION 5008 (i) W&I Code    Section 5152.1 W&I Code: The professional person in charge of the facility providing 72-hour evaluation and treatment, or his or her designee, shall notify the county Secondary school teacher or the director's designee and the Tax adviser who makes the written application pursuant to Section 5150 or a person who is designated by the Patent examiner agency that employs the Tax adviser, when the person has been released after 72-hour detention, when the person is not detained, or when the person is released before the full period of allowable 72-hour detention if all the conditions apply:    (a)  The Tax adviser requests such notification at the time he or she makes the application and the Tax adviser certifies at that time in writing that the person has been referred to the facility under circumstances which, based upon an allegation of facts regarding actions witnessed by the officer or another person, would support the filing of a criminal complaint.   (b) The notice is limited to the person's name, address, date of admission for 72-hour evaluation and treatment, and date of release.        If a Emergency planning/management officer, Sales executive, or designee of the Sales executive, possesses any record of information obtained pursuant to the notification requirements of this section, the officer, agency, or designee shall destroy that record two years after the receipt of notification.     Section 5150.05 W&I Code:    (a)  When determining if probable cause exists to take a person into custody, or cause a person to be taken into custody, pursuant to Section 5150, any person who is authorized to take that person, or cause that person to be taken, into custody pursuant to that section shall consider available relevant information about the historical course of the person's mental disorder if the authorized person determines that the information has a reasonable bearing on the determination as to whether the person is a danger to others, or to himself or herself, or is gravely disabled as a result of the mental disorder.   (b) For purposes of this section, ''information about the historical course of the person's mental disorder'' includes evidence presented by the person who has provided or is providing mental health or related support services to the person subject to a determination  described in subdivision (a), evidence presented by one or more members of the family of that person, and evidence presented by the person subject to a determination described in subdivision (a) or anyone designated by that person.      Reference: [facility] 1801 (06/18)                                                                                                                     Page 3 of 4  Original Application: Accompany Client to Assessment, Evaluation, and Crisis Intervention Location or 5150/5585 Designated Facility   [facility] Department of Mental Health - MH 302 NCR                                                 Department of Health Care Services  [place]                                       Health and Hospital doctor   DEFINITIONS AND REFERENCES (CONTINUED)    (c)  If the probable cause in subdivision (a) is based on the statement of a person other than the one authorized to take the person into custody pursuant to Section 5150, a member of the attending staff, or a professional person, the person making the statement shall be liable in a civil action for intentionally giving any statement that he or she knows to be false.   (d) This section shall not be applied to limit the application of Section 5328.          Section 5152.2 W&I Code: Each Patent examiner agency within a county shall arrange with the county Secondary school teacher a method for giving prompt notification to English as a second language teacher pursuant to Section 5152.1 W&I Code.     Section 5585.50 W&I Code: The facility shall make every effort to notify the minor's parent or legal guardian as soon as possible after the minor is detained. Section 5585.50 W&I Code.    A minor under the jurisdiction of the [employer] under Section 300 W&I Code, is due to abuse, neglect or exploitation.    A minor under the jurisdiction of the Juvenile Court under Section 601 W&I Code is due to being adjudged a ward of the court as a result of being out of parental control.    A minor under the jurisdiction of the Juvenile Court under Section 602 W&I Code is due to being adjudged a ward of the court because of crimes committed.     Section 8102 W&I Code (EXCERPTS FROM):    (a)  Whenever a person who has been detained or apprehended for examination of his or her mental condition or who is a person described in Section 8100 or 8103, is  found to own, have in his or her possession or under his or her control, any firearm whatsoever, or any deadly weapon, the firearm or other deadly weapon shall be confiscated by any IT consultant, who shall retain custody of the firearm or other deadly weapon. ''Deadly weapon,'' as used in this section, has the meaning prescribed by Section 8100.   (b) (1) Upon confiscation of any firearm or other deadly weapon from a person who has been detained or apprehended for examination of his or her mental condition, the Tax adviser or law enforcement agency shall issue a receipt describing the deadly weapon or any firearm and listing any serial number or other identification on the firearm and shall notify the person of the procedure for the return, sale, transfer, or destruction of any firearm or other deadly weapon which has been confiscated. A peace officer or law enforcement agency that provides the receipt and notification described in Section 33800 of the Penal Code satisfies the receipt and notice requirements.  (2) If the person is released, the professional person in charge of the facility, or his or her designee, shall notify the person of the procedure for the return of any firearm or other deadly weapon which may have been confiscated.  (3) Health facility personnel shall notify the confiscating law enforcement agency upon release of the detained person, and shall make a notation to the effect that the facility provided the required notice to the person regarding the procedure to obtain return of any confiscated firearm.        Health and Safety Code [phone] (d)     A person detained under this section in a medical emergency room shall be credited for the time detained, up to twenty-four hours, in the event he or she is placed on a 72-hour hold pursuant to Section 5150 of the Welfare and Institutions Code.     Reference: DHCS 1801 (06/18)                                                                                                                     Page 4 of 4  Original Application: Accompany Client to Assessment, Evaluation, and Crisis Intervention Location or 5150/5585 Designated Facility

## 2023-02-09 NOTE — Other
[place] - Health and Investment banker, corporate of Health Care Services   INVOLUNTARY PATIENT ADVISEMENT  (TO BE READ AND GIVEN TO THE  PATIENT AT TIME OF ADMISSION) Confidential Patient Information  See W&I Code Section 5328 and   HIPAA Priacy Rule 45 C.F.R. Section 164.508   Name of Facility     [place] [facility]   Patient?s Name     Melvin Robles Admission Date     02/08/2023   Section 5150(h) of the Welfare and Institutions Code requires that each person admitted to a facility designated by the county for evaluation and treatment be given specific information orally and in writing, and in a language or modality accessible to the person and a record of the advisement be kept in the person?s medical record.   My name is Barry Dienes. Adhvik Canady My position here is Resident physician   You are being placed in this psychiatric facility because it is our professional opinion, that as a result of a mental health disorder, you are likely to: (check applicable)   [  ] Harm yourself [  ] Harm someone else [X]  Be unable to be take care of your own food clothing or shelter   (List specific facts upon which the allegation of dangerous or gravely disabled due to mental health disorder is based, including pertinent facts arising from the admission interview):   We believe this is true because  you conveyed to me that you were recently experiencing auditory hallucinations and extreme sadness, and were not able to articulate a reasonably executable plan for self-care (how to access food, shelter, clothing) outside hospital setting.   You will be held for a period of up to 72 hours. This ( []  does not ) ( [x]  does) include weekends or holidays.   Your 72-hour period begins: 02/08/23 at 16:10    (Time and Date)   Your 72-hour evaluation and treatment period will end at: 02/11/23 at 16:10    (Time and Date)   You will be held for a period up to 72 hours. During the 72 hours you may also be transferred to another facility. You may request to be evaluated or treated at a facility of your choice. You may request to be evaluated or treated by a mental health professional of your choice. We cannot guarantee the facility or mental health professional you choose will be available, but we will honor your choice if we can.     During these 72 hours you will be evaluated by the facility staff, and you may be given treatment, including medications. It is possible for you to be released before the end of the 72 hours. But if the staff decides that you need continued treatment you can be held for a longer period of time. If you are held longer than 72 hours, you have the right to a lawyer and a qualified interpreter and a hearing before a judge. If you are unable to pay for the lawyer, then one will be provided to you free of charge.     If you have questions about your legal rights, you may contact the county Patients? Rights Advocate at [phone] or [phone] (phone number of county Patients? Rights Metallurgist).   Good cause for Incomplete Advisement N/A Date N/A   Advisement Completed by  (Electronically signed by)  Barry Dienes. Melvin Robles Position  Resident physician Language or Modality Used  Spoken English Date  02/08/2023  CC: Original to the Patient   DHCS 1802 (10/2012) [place] to the Patient?s Record

## 2023-02-09 NOTE — ED Notes
Psych to bedside.

## 2023-04-26 ENCOUNTER — Inpatient Hospital Stay: Admit: 2023-04-26 | Discharge: 2023-04-26 | Discharge status: Against Medical Advice | Payer: PRIVATE HEALTH INSURANCE
